# Patient Record
Sex: Male | Born: 1950
Health system: Southern US, Community
[De-identification: ages and names within clinical notes are randomized; demographics above are authoritative.]

## PROBLEM LIST (undated history)

## (undated) DIAGNOSIS — F329 Major depressive disorder, single episode, unspecified: Secondary | ICD-10-CM

## (undated) DIAGNOSIS — K219 Gastro-esophageal reflux disease without esophagitis: Secondary | ICD-10-CM

## (undated) DIAGNOSIS — C801 Malignant (primary) neoplasm, unspecified: Secondary | ICD-10-CM

## (undated) DIAGNOSIS — F32A Depression, unspecified: Secondary | ICD-10-CM

## (undated) DIAGNOSIS — J449 Chronic obstructive pulmonary disease, unspecified: Secondary | ICD-10-CM

## (undated) DIAGNOSIS — E119 Type 2 diabetes mellitus without complications: Secondary | ICD-10-CM

## (undated) DIAGNOSIS — E785 Hyperlipidemia, unspecified: Secondary | ICD-10-CM

## (undated) DIAGNOSIS — Z85528 Personal history of other malignant neoplasm of kidney: Secondary | ICD-10-CM

## (undated) DIAGNOSIS — N189 Chronic kidney disease, unspecified: Secondary | ICD-10-CM

## (undated) DIAGNOSIS — G459 Transient cerebral ischemic attack, unspecified: Secondary | ICD-10-CM

## (undated) DIAGNOSIS — M109 Gout, unspecified: Secondary | ICD-10-CM

## (undated) DIAGNOSIS — I10 Essential (primary) hypertension: Secondary | ICD-10-CM

## (undated) DIAGNOSIS — I2510 Atherosclerotic heart disease of native coronary artery without angina pectoris: Secondary | ICD-10-CM

## (undated) HISTORY — DX: Type 2 diabetes mellitus without complications: E11.9

## (undated) HISTORY — DX: Transient cerebral ischemic attack, unspecified: G45.9

## (undated) HISTORY — DX: Personal history of other malignant neoplasm of kidney: Z85.528

## (undated) HISTORY — DX: Gout, unspecified: M10.9

## (undated) HISTORY — DX: Gastro-esophageal reflux disease without esophagitis: K21.9

## (undated) HISTORY — PX: CHOLECYSTECTOMY: SHX55

## (undated) HISTORY — DX: Essential (primary) hypertension: I10

## (undated) HISTORY — DX: Depression, unspecified: F32.A

## (undated) HISTORY — PX: DIAGNOSTIC LAPAROSCOPY: SUR761

## (undated) HISTORY — PX: EYE SURGERY: SHX253

## (undated) HISTORY — PX: PROSTATECTOMY: SHX69

## (undated) HISTORY — DX: Major depressive disorder, single episode, unspecified: F32.9

## (undated) HISTORY — PX: CATARACT EXTRACTION: SUR2

---

## 1999-10-25 HISTORY — PX: KIDNEY SURGERY: SHX687

## 2005-05-22 ENCOUNTER — Emergency Department: Payer: Self-pay | Admitting: Emergency Medicine

## 2005-05-27 ENCOUNTER — Ambulatory Visit: Payer: Self-pay | Admitting: Internal Medicine

## 2005-05-31 ENCOUNTER — Ambulatory Visit: Payer: Self-pay | Admitting: Internal Medicine

## 2006-05-31 ENCOUNTER — Ambulatory Visit: Payer: Self-pay | Admitting: Internal Medicine

## 2006-09-21 ENCOUNTER — Ambulatory Visit: Payer: Self-pay | Admitting: Gastroenterology

## 2007-04-04 ENCOUNTER — Ambulatory Visit: Payer: Self-pay | Admitting: Internal Medicine

## 2007-06-11 ENCOUNTER — Ambulatory Visit: Payer: Self-pay | Admitting: Internal Medicine

## 2008-07-10 ENCOUNTER — Other Ambulatory Visit: Payer: Self-pay

## 2008-07-10 ENCOUNTER — Inpatient Hospital Stay: Payer: Self-pay | Admitting: Internal Medicine

## 2008-09-03 ENCOUNTER — Ambulatory Visit: Payer: Self-pay | Admitting: Internal Medicine

## 2012-12-06 ENCOUNTER — Observation Stay: Payer: Self-pay

## 2012-12-06 LAB — CK TOTAL AND CKMB (NOT AT ARMC): CK-MB: 1.6 ng/mL (ref 0.5–3.6)

## 2012-12-06 LAB — COMPREHENSIVE METABOLIC PANEL
Albumin: 3.9 g/dL (ref 3.4–5.0)
Anion Gap: 4 — ABNORMAL LOW (ref 7–16)
Bilirubin,Total: 0.8 mg/dL (ref 0.2–1.0)
Chloride: 106 mmol/L (ref 98–107)
Creatinine: 1.31 mg/dL — ABNORMAL HIGH (ref 0.60–1.30)
EGFR (African American): >60
EGFR (Non-African Amer.): 58 — ABNORMAL LOW
Osmolality: 278 (ref 275–301)
SGOT(AST): 36 U/L (ref 15–37)
Sodium: 138 mmol/L (ref 136–145)
Total Protein: 7.5 g/dL (ref 6.4–8.2)

## 2012-12-06 LAB — CBC
HCT: 47.4 % (ref 40.0–52.0)
MCHC: 33.4 g/dL (ref 32.0–36.0)
MCV: 87 fL (ref 80–100)
Platelet: 225 10*3/uL (ref 150–440)
RBC: 5.47 10*6/uL (ref 4.40–5.90)
RDW: 13.7 % (ref 11.5–14.5)
WBC: 11.6 10*3/uL — ABNORMAL HIGH (ref 3.8–10.6)

## 2012-12-06 LAB — URINALYSIS, COMPLETE
Blood: NEGATIVE
Ketone: NEGATIVE
Leukocyte Esterase: NEGATIVE
Ph: 6 (ref 4.5–8.0)
RBC,UR: <1 /HPF (ref 0–5)
Squamous Epithelial: NONE SEEN

## 2012-12-06 LAB — TROPONIN I: Troponin-I: <0.02 ng/mL

## 2012-12-07 LAB — CBC WITH DIFFERENTIAL/PLATELET
Basophil %: 0.9 %
Eosinophil #: 0.2 10*3/uL (ref 0.0–0.7)
HGB: 15.1 g/dL (ref 13.0–18.0)
Lymphocyte #: 2.2 10*3/uL (ref 1.0–3.6)
Lymphocyte %: 27.4 %
MCH: 29 pg (ref 26.0–34.0)
MCHC: 33.6 g/dL (ref 32.0–36.0)
Monocyte %: 11.8 %
Neutrophil #: 4.6 10*3/uL (ref 1.4–6.5)
Neutrophil %: 57 %
RBC: 5.2 10*6/uL (ref 4.40–5.90)
RDW: 13.3 % (ref 11.5–14.5)
WBC: 8.1 10*3/uL (ref 3.8–10.6)

## 2012-12-07 LAB — BASIC METABOLIC PANEL
Anion Gap: 6 — ABNORMAL LOW (ref 7–16)
Calcium, Total: 8.5 mg/dL (ref 8.5–10.1)
Co2: 28 mmol/L (ref 21–32)
EGFR (African American): >60
Glucose: 101 mg/dL — ABNORMAL HIGH (ref 65–99)
Osmolality: 282 (ref 275–301)
Sodium: 140 mmol/L (ref 136–145)

## 2012-12-07 LAB — MAGNESIUM: Magnesium: 2.1 mg/dL

## 2012-12-07 LAB — TROPONIN I: Troponin-I: <0.02 ng/mL

## 2012-12-07 LAB — LIPID PANEL: HDL Cholesterol: 31 mg/dL — ABNORMAL LOW (ref 40–60)

## 2013-05-27 ENCOUNTER — Ambulatory Visit: Payer: Self-pay | Admitting: General Practice

## 2013-08-05 ENCOUNTER — Encounter: Payer: Self-pay | Admitting: Specialist

## 2013-08-24 ENCOUNTER — Encounter: Payer: Self-pay | Admitting: Specialist

## 2013-09-23 ENCOUNTER — Encounter: Payer: Self-pay | Admitting: Specialist

## 2014-01-01 ENCOUNTER — Ambulatory Visit: Payer: Self-pay

## 2014-01-09 ENCOUNTER — Ambulatory Visit: Payer: Self-pay | Admitting: Urology

## 2014-02-25 ENCOUNTER — Ambulatory Visit: Payer: Self-pay | Admitting: Family Medicine

## 2014-02-25 LAB — DOT URINE DIP
Blood: NEGATIVE
GLUCOSE, UR: NEGATIVE mg/dL (ref 0–75)
PROTEIN: NEGATIVE
SPECIFIC GRAVITY: 1.015 (ref 1.003–1.030)

## 2014-02-28 DIAGNOSIS — Q619 Cystic kidney disease, unspecified: Secondary | ICD-10-CM | POA: Insufficient documentation

## 2014-02-28 DIAGNOSIS — Z8546 Personal history of malignant neoplasm of prostate: Secondary | ICD-10-CM | POA: Insufficient documentation

## 2014-02-28 DIAGNOSIS — Z85528 Personal history of other malignant neoplasm of kidney: Secondary | ICD-10-CM | POA: Insufficient documentation

## 2014-02-28 DIAGNOSIS — R109 Unspecified abdominal pain: Secondary | ICD-10-CM | POA: Insufficient documentation

## 2015-02-04 DIAGNOSIS — Q619 Cystic kidney disease, unspecified: Secondary | ICD-10-CM | POA: Diagnosis not present

## 2015-02-04 DIAGNOSIS — Z8546 Personal history of malignant neoplasm of prostate: Secondary | ICD-10-CM | POA: Diagnosis not present

## 2015-02-04 DIAGNOSIS — Z85528 Personal history of other malignant neoplasm of kidney: Secondary | ICD-10-CM | POA: Diagnosis not present

## 2015-02-13 NOTE — Discharge Summary (Signed)
PATIENT NAME:  Joel George, Joel George MR#:  [id number] DATE OF BIRTH:  06-06-1951  DATE OF ADMISSION:  12/06/2012 DATE OF DISCHARGE:  12/07/2012  PRIMARY CARE PHYSICIAN:  Dr. Adrian Prows.   DISCHARGE DIAGNOSES:  1.  Transient ischemic attack with facial numbness.  2.  Hypertension.   ADMISSION HISTORY AND PHYSICAL:  Please see admission note from February 13th.  Briefly, this is a 64 year old gentleman with known prostate and kidney disease who noted tingling on his left side of his face and drooling.  The symptoms lasted 15 to 20 minutes.  He came to the Emergency Room because he was concerned about a TIA and stroke.  Symptoms resolved.   HOSPITAL COURSE BY ISSUE:  1.  Facial numbness, likely TIA.  The patient was admitted.  He had serial troponins done which were negative.  Telemetry was normal.  The patient had    CANCELLED BY DICTATOR    ____________________________ Cheral Marker. Ola Spurr, MD dpf:ea D: 12/12/2012 22:05:16 ET T: 12/13/2012 06:01:45 ET JOB#: [id number]  cc: Cheral Marker. Ola Spurr, MD, <Dictator> Jimmy Ola Spurr MD ELECTRONICALLY SIGNED 12/26/2012 19:24

## 2015-02-13 NOTE — Discharge Summary (Signed)
PATIENT NAME:  Joel George, Joel George MR#:  [id number] DATE OF BIRTH:  1951/01/05  DATE OF ADMISSION:  12/06/2012 DATE OF DISCHARGE:  12/07/2012  PRIMARY CARE PHYSICIAN: Cheral Marker. Ola Spurr, MD   DISCHARGE DIAGNOSES:  1. Transient ischemic attack with left facial numbness.  2. Borderline hypertension.  3. Borderline renal insufficiency with solitary kidney.   HISTORY OF PRESENT ILLNESS: Please see admission history and physical for details. Briefly, this is a 64 year old relatively healthy gentleman, on no medications except for an aspirin a day, who presented with acute onset of left-sided facial numbness and face drawing down and drooling. This lasted 15 to 20 minutes. He was admitted for TIA workup.   HOSPITAL COURSE BY ISSUE:  1. TIA. The patient had resolution of symptoms prior to arrival. Telemetry was negative, and troponins were negative. Carotid Doppler showed no evidence of hemodynamically significant stenosis. Echocardiogram was normal. MRI of the brain showed no acute infarct. The patient will be discharged on aspirin 325 once a day as well as statin. His LDL was less than 100, but given the possible TIA, this will be followed closely.  2. Borderline hypertension. The patient has solitary kidney. Reconsider adding outpatient antihypertensives.  3. Kidney insufficiency. This has remained stable.   DISCHARGE MEDICATIONS:  1. Aspirin 325 once a day.  2. Lovastatin 20 mg once a day.   DISCHARGE FOLLOWUP: The patient will follow up with Dr. Ola Spurr within 2 weeks' time. He will call if worse or new symptoms occur.   DISCHARGE DIET: Low fat, low salt, regular consistency.   ACTIVITY: As tolerated.   This discharge took 35 minutes.    ____________________________ Cheral Marker. Ola Spurr, MD dpf:OSi D: 12/07/2012 14:47:30 ET T: 12/08/2012 09:17:20 ET JOB#: [id number]  cc: Cheral Marker. Ola Spurr, MD, <Dictator> Jermond Ola Spurr MD ELECTRONICALLY SIGNED 12/26/2012 19:23

## 2015-02-13 NOTE — H&P (Signed)
PATIENT NAME:  Joel George, Joel George MR#:  [id number] DATE OF BIRTH:  05/23/1951  DATE OF ADMISSION:  12/06/2012  PRIMARY CARE PHYSICIAN: Cheral Marker. Ola Spurr, MD   REFERRING PHYSICIAN: Conni Slipper, MD  CHIEF COMPLAINT: Left-sided facial numbness and tingling.   HISTORY OF PRESENT ILLNESS: The patient is a 64 year old male with a known history of prostate and kidney cancer who is being admitted for suspected TIA. The patient noticed tingling and numbness on his left side of his face, along with drooling from his right side of the corner of the mouth this afternoon. It lasted about 15 to 20 minutes. Symptoms went away after that. He was lying on his right side of the body and does not feel that had anything to do with it. He Googled it up on WebMD and found that this could be TIA. He took aspirin and was worried for TIA so came to the Emergency Department. While in the ED, his symptoms are gone. He is being admitted for further evaluation and management, as this was concerning for TIA. His CT head was negative.   PAST MEDICAL HISTORY: Kidney cancer and prostate cancer, status post left kidney removal.   ALLERGIES: No known drug allergies.   MEDICATIONS AT HOME: None.   SOCIAL HISTORY: Nonsmoking, occasional alcohol, no IV drugs of abuse. He is taking care of his parents.   FAMILY HISTORY: One brother had thyroid disease, otherwise unremarkable.   REVIEW OF SYSTEMS:  CONSTITUTIONAL: No fever, fatigue, weakness.  EYES: No blurred or double vision.  ENT: No tinnitus or ear pain.  RESPIRATORY: No cough, wheezing, hemoptysis.  CARDIOVASCULAR: No chest pain, orthopnea, edema.  GASTROINTESTINAL: No nausea, vomiting, diarrhea.  GENITOURINARY: No dysuria or hematuria.  ENDOCRINE: No polyuria or nocturia.  HEMATOLOGIC: No anemia or easy bruising.  SKIN: No rash or lesion.  MUSCULOSKELETAL: No arthritis or muscle cramps.  NEUROLOGIC: He had some tingling and numbness on his left side of face and drooling  from his right mouth corner which are gone.  PSYCHIATRIC: No history of anxiety or depression.   PHYSICAL EXAMINATION:  VITAL SIGNS: Temperature 98.5, heart rate 78 per minute, respirations 19 per minute, blood pressure 139/74 mmHg, saturating 96% on room air. GENERAL: The patient is a 64 year old male lying in the bed comfortably without any acute distress.  EYES: Pupils equal, round, reactive to light and accommodation. No scleral icterus. Extraocular muscles intact.  HENT: Head atraumatic, normocephalic. Oropharynx and nasopharynx clear.  NECK: Supple. No jugular venous distention. No thyroid enlargement or tenderness.  LUNGS: Clear to auscultation bilaterally. No wheezing, rales, rhonchi or crepitation.  CARDIOVASCULAR: S1, S2 normal. No murmurs, rubs or gallop.  ABDOMEN: Soft, nontender, nondistended. Bowel sounds present. No organomegaly or masses. He has left flank surgical scar from kidney removal.  NEUROLOGIC: Cranial nerves II through XII intact. Muscle strength 5/5 in all extremities. Sensation intact.  PSYCHIATRIC: The patient is oriented to time, place and person x 3.  SKIN: No obvious rash, lesion or ulcer, except surgical scar on the left side of his flank.  EXTREMITIES: No pedal edema, cyanosis or clubbing.   LABORATORY, RADIOLOGICAL AND DIAGNOSTIC DATA: Normal CBC except white count of 11.6. Normal first set of cardiac enzymes. Normal liver function tests. Normal BMP except creatinine of 1.31 and BUN of 20. UA was negative.   CT scan of the head while in the ED showed no acute abnormality.   EKG has not been done yet.   IMPRESSION AND PLAN:  1.  Suspected transient ischemic attack: Will do serial troponins, obtain 2-D echo, carotid Dopplers, and an MRI of the brain has been ordered. Will get physical and occupational therapy evaluation and management. Will start him on aspirin for his transient ischemic attack and statin. Will also check his fasting lipid profile.  2.  Acute  renal failure: Will hydrate her with IV fluids. Avoid any nephrotoxic medication. This is likely prerenal.   CODE STATUS: Full code.   TIME SPENT: Total time taking care of this patient: 45 minutes.   ____________________________ Lucina Mellow. Manuella Ghazi, MD vss:jm D: 12/06/2012 20:52:26 ET T: 12/06/2012 21:22:50 ET JOB#: [id number]  cc: Kyndall Chaplin S. Manuella Ghazi, MD, <Dictator> Cheral Marker. Ola Spurr, [place] MD ELECTRONICALLY SIGNED 12/08/2012 10:16

## 2015-05-13 DIAGNOSIS — H524 Presbyopia: Secondary | ICD-10-CM | POA: Diagnosis not present

## 2015-05-13 DIAGNOSIS — H2513 Age-related nuclear cataract, bilateral: Secondary | ICD-10-CM | POA: Diagnosis not present

## 2015-06-15 DIAGNOSIS — Z0000 Encounter for general adult medical examination without abnormal findings: Secondary | ICD-10-CM | POA: Diagnosis not present

## 2015-06-15 DIAGNOSIS — E785 Hyperlipidemia, unspecified: Secondary | ICD-10-CM | POA: Diagnosis not present

## 2015-06-15 DIAGNOSIS — E119 Type 2 diabetes mellitus without complications: Secondary | ICD-10-CM | POA: Diagnosis not present

## 2015-06-22 DIAGNOSIS — E119 Type 2 diabetes mellitus without complications: Secondary | ICD-10-CM | POA: Diagnosis not present

## 2015-06-22 DIAGNOSIS — E780 Pure hypercholesterolemia: Secondary | ICD-10-CM | POA: Diagnosis not present

## 2015-06-22 DIAGNOSIS — E6609 Other obesity due to excess calories: Secondary | ICD-10-CM | POA: Diagnosis not present

## 2015-06-22 DIAGNOSIS — N183 Chronic kidney disease, stage 3 (moderate): Secondary | ICD-10-CM | POA: Diagnosis not present

## 2015-06-23 DIAGNOSIS — Z23 Encounter for immunization: Secondary | ICD-10-CM | POA: Diagnosis not present

## 2015-07-02 ENCOUNTER — Other Ambulatory Visit: Payer: Self-pay | Admitting: Infectious Diseases

## 2015-07-02 DIAGNOSIS — Z136 Encounter for screening for cardiovascular disorders: Secondary | ICD-10-CM

## 2015-07-02 DIAGNOSIS — Z87891 Personal history of nicotine dependence: Secondary | ICD-10-CM

## 2015-07-13 ENCOUNTER — Ambulatory Visit: Admission: RE | Admit: 2015-07-13 | Payer: Self-pay | Source: Ambulatory Visit

## 2015-07-22 ENCOUNTER — Ambulatory Visit
Admission: RE | Admit: 2015-07-22 | Discharge: 2015-07-22 | Disposition: A | Discharge status: Home / Self Care | Payer: Medicare Other | Source: Ambulatory Visit | Attending: Infectious Diseases | Admitting: Infectious Diseases

## 2015-07-22 DIAGNOSIS — Z87891 Personal history of nicotine dependence: Secondary | ICD-10-CM | POA: Insufficient documentation

## 2015-07-22 DIAGNOSIS — Z136 Encounter for screening for cardiovascular disorders: Secondary | ICD-10-CM | POA: Diagnosis not present

## 2015-07-22 DIAGNOSIS — I714 Abdominal aortic aneurysm, without rupture: Secondary | ICD-10-CM | POA: Diagnosis not present

## 2015-08-22 DIAGNOSIS — Z23 Encounter for immunization: Secondary | ICD-10-CM | POA: Diagnosis not present

## 2016-03-18 DIAGNOSIS — Z905 Acquired absence of kidney: Secondary | ICD-10-CM | POA: Diagnosis not present

## 2016-03-18 DIAGNOSIS — Z8546 Personal history of malignant neoplasm of prostate: Secondary | ICD-10-CM | POA: Diagnosis not present

## 2016-03-18 DIAGNOSIS — Z85528 Personal history of other malignant neoplasm of kidney: Secondary | ICD-10-CM | POA: Diagnosis not present

## 2016-03-18 DIAGNOSIS — Q619 Cystic kidney disease, unspecified: Secondary | ICD-10-CM | POA: Diagnosis not present

## 2016-03-18 DIAGNOSIS — N281 Cyst of kidney, acquired: Secondary | ICD-10-CM | POA: Diagnosis not present

## 2016-04-20 DIAGNOSIS — H2513 Age-related nuclear cataract, bilateral: Secondary | ICD-10-CM | POA: Diagnosis not present

## 2016-04-20 DIAGNOSIS — H47032 Optic nerve hypoplasia, left eye: Secondary | ICD-10-CM | POA: Diagnosis not present

## 2016-04-20 DIAGNOSIS — H524 Presbyopia: Secondary | ICD-10-CM | POA: Diagnosis not present

## 2016-06-20 DIAGNOSIS — E119 Type 2 diabetes mellitus without complications: Secondary | ICD-10-CM | POA: Diagnosis not present

## 2016-06-20 DIAGNOSIS — Z114 Encounter for screening for human immunodeficiency virus [HIV]: Secondary | ICD-10-CM | POA: Diagnosis not present

## 2016-06-20 DIAGNOSIS — E7800 Pure hypercholesterolemia, unspecified: Secondary | ICD-10-CM | POA: Diagnosis not present

## 2016-06-20 DIAGNOSIS — N183 Chronic kidney disease, stage 3 (moderate): Secondary | ICD-10-CM | POA: Diagnosis not present

## 2016-06-20 DIAGNOSIS — E6609 Other obesity due to excess calories: Secondary | ICD-10-CM | POA: Diagnosis not present

## 2016-06-22 DIAGNOSIS — Z85528 Personal history of other malignant neoplasm of kidney: Secondary | ICD-10-CM | POA: Diagnosis not present

## 2016-06-22 DIAGNOSIS — Z8546 Personal history of malignant neoplasm of prostate: Secondary | ICD-10-CM | POA: Diagnosis not present

## 2016-06-22 DIAGNOSIS — N183 Chronic kidney disease, stage 3 (moderate): Secondary | ICD-10-CM | POA: Diagnosis not present

## 2016-06-22 DIAGNOSIS — E6609 Other obesity due to excess calories: Secondary | ICD-10-CM | POA: Diagnosis not present

## 2016-06-22 DIAGNOSIS — E119 Type 2 diabetes mellitus without complications: Secondary | ICD-10-CM | POA: Diagnosis not present

## 2016-06-22 DIAGNOSIS — E7800 Pure hypercholesterolemia, unspecified: Secondary | ICD-10-CM | POA: Diagnosis not present

## 2016-06-22 DIAGNOSIS — Z8739 Personal history of other diseases of the musculoskeletal system and connective tissue: Secondary | ICD-10-CM | POA: Diagnosis not present

## 2016-06-22 DIAGNOSIS — Z0000 Encounter for general adult medical examination without abnormal findings: Secondary | ICD-10-CM | POA: Diagnosis not present

## 2016-08-13 DIAGNOSIS — Z23 Encounter for immunization: Secondary | ICD-10-CM | POA: Diagnosis not present

## 2016-10-19 DIAGNOSIS — E119 Type 2 diabetes mellitus without complications: Secondary | ICD-10-CM | POA: Diagnosis not present

## 2016-10-25 DIAGNOSIS — N183 Chronic kidney disease, stage 3 (moderate): Secondary | ICD-10-CM | POA: Diagnosis not present

## 2016-10-25 DIAGNOSIS — E118 Type 2 diabetes mellitus with unspecified complications: Secondary | ICD-10-CM | POA: Insufficient documentation

## 2016-10-25 DIAGNOSIS — E7800 Pure hypercholesterolemia, unspecified: Secondary | ICD-10-CM | POA: Diagnosis not present

## 2016-10-25 DIAGNOSIS — R809 Proteinuria, unspecified: Secondary | ICD-10-CM | POA: Insufficient documentation

## 2016-11-21 DIAGNOSIS — E1122 Type 2 diabetes mellitus with diabetic chronic kidney disease: Secondary | ICD-10-CM | POA: Diagnosis not present

## 2016-11-21 DIAGNOSIS — N183 Chronic kidney disease, stage 3 (moderate): Secondary | ICD-10-CM | POA: Diagnosis not present

## 2016-11-21 DIAGNOSIS — R809 Proteinuria, unspecified: Secondary | ICD-10-CM | POA: Diagnosis not present

## 2016-11-21 DIAGNOSIS — I129 Hypertensive chronic kidney disease with stage 1 through stage 4 chronic kidney disease, or unspecified chronic kidney disease: Secondary | ICD-10-CM | POA: Diagnosis not present

## 2016-12-27 DIAGNOSIS — H25013 Cortical age-related cataract, bilateral: Secondary | ICD-10-CM | POA: Diagnosis not present

## 2016-12-27 DIAGNOSIS — H18413 Arcus senilis, bilateral: Secondary | ICD-10-CM | POA: Diagnosis not present

## 2016-12-27 DIAGNOSIS — H2512 Age-related nuclear cataract, left eye: Secondary | ICD-10-CM | POA: Diagnosis not present

## 2016-12-27 DIAGNOSIS — H25043 Posterior subcapsular polar age-related cataract, bilateral: Secondary | ICD-10-CM | POA: Diagnosis not present

## 2016-12-27 DIAGNOSIS — H2513 Age-related nuclear cataract, bilateral: Secondary | ICD-10-CM | POA: Diagnosis not present

## 2017-01-04 DIAGNOSIS — I129 Hypertensive chronic kidney disease with stage 1 through stage 4 chronic kidney disease, or unspecified chronic kidney disease: Secondary | ICD-10-CM | POA: Diagnosis not present

## 2017-01-04 DIAGNOSIS — N183 Chronic kidney disease, stage 3 (moderate): Secondary | ICD-10-CM | POA: Diagnosis not present

## 2017-01-04 DIAGNOSIS — R809 Proteinuria, unspecified: Secondary | ICD-10-CM | POA: Diagnosis not present

## 2017-01-04 DIAGNOSIS — E1122 Type 2 diabetes mellitus with diabetic chronic kidney disease: Secondary | ICD-10-CM | POA: Diagnosis not present

## 2017-01-04 DIAGNOSIS — N2581 Secondary hyperparathyroidism of renal origin: Secondary | ICD-10-CM | POA: Diagnosis not present

## 2017-01-18 DIAGNOSIS — N183 Chronic kidney disease, stage 3 (moderate): Secondary | ICD-10-CM | POA: Diagnosis not present

## 2017-01-18 DIAGNOSIS — E1122 Type 2 diabetes mellitus with diabetic chronic kidney disease: Secondary | ICD-10-CM | POA: Diagnosis not present

## 2017-01-18 DIAGNOSIS — E785 Hyperlipidemia, unspecified: Secondary | ICD-10-CM | POA: Diagnosis not present

## 2017-01-18 DIAGNOSIS — I10 Essential (primary) hypertension: Secondary | ICD-10-CM | POA: Diagnosis not present

## 2017-02-13 DIAGNOSIS — Z961 Presence of intraocular lens: Secondary | ICD-10-CM | POA: Diagnosis not present

## 2017-02-13 DIAGNOSIS — H2512 Age-related nuclear cataract, left eye: Secondary | ICD-10-CM | POA: Diagnosis not present

## 2017-02-14 DIAGNOSIS — H2511 Age-related nuclear cataract, right eye: Secondary | ICD-10-CM | POA: Diagnosis not present

## 2017-02-27 DIAGNOSIS — H2511 Age-related nuclear cataract, right eye: Secondary | ICD-10-CM | POA: Diagnosis not present

## 2017-02-27 DIAGNOSIS — Z961 Presence of intraocular lens: Secondary | ICD-10-CM | POA: Diagnosis not present

## 2017-03-28 DIAGNOSIS — E785 Hyperlipidemia, unspecified: Secondary | ICD-10-CM | POA: Diagnosis not present

## 2017-03-28 DIAGNOSIS — E1122 Type 2 diabetes mellitus with diabetic chronic kidney disease: Secondary | ICD-10-CM | POA: Diagnosis not present

## 2017-03-28 DIAGNOSIS — I129 Hypertensive chronic kidney disease with stage 1 through stage 4 chronic kidney disease, or unspecified chronic kidney disease: Secondary | ICD-10-CM | POA: Diagnosis not present

## 2017-03-28 DIAGNOSIS — N183 Chronic kidney disease, stage 3 (moderate): Secondary | ICD-10-CM | POA: Diagnosis not present

## 2017-04-11 DIAGNOSIS — Z8546 Personal history of malignant neoplasm of prostate: Secondary | ICD-10-CM | POA: Diagnosis not present

## 2017-04-11 DIAGNOSIS — Q619 Cystic kidney disease, unspecified: Secondary | ICD-10-CM | POA: Diagnosis not present

## 2017-04-11 DIAGNOSIS — Z85528 Personal history of other malignant neoplasm of kidney: Secondary | ICD-10-CM | POA: Diagnosis not present

## 2017-04-18 DIAGNOSIS — E784 Other hyperlipidemia: Secondary | ICD-10-CM | POA: Diagnosis not present

## 2017-04-18 DIAGNOSIS — N183 Chronic kidney disease, stage 3 (moderate): Secondary | ICD-10-CM | POA: Diagnosis not present

## 2017-04-18 DIAGNOSIS — E119 Type 2 diabetes mellitus without complications: Secondary | ICD-10-CM | POA: Diagnosis not present

## 2017-04-25 DIAGNOSIS — E118 Type 2 diabetes mellitus with unspecified complications: Secondary | ICD-10-CM | POA: Diagnosis not present

## 2017-04-25 DIAGNOSIS — Z122 Encounter for screening for malignant neoplasm of respiratory organs: Secondary | ICD-10-CM | POA: Diagnosis not present

## 2017-04-25 DIAGNOSIS — Z1159 Encounter for screening for other viral diseases: Secondary | ICD-10-CM | POA: Diagnosis not present

## 2017-04-25 DIAGNOSIS — N183 Chronic kidney disease, stage 3 (moderate): Secondary | ICD-10-CM | POA: Diagnosis not present

## 2017-04-25 DIAGNOSIS — E7800 Pure hypercholesterolemia, unspecified: Secondary | ICD-10-CM | POA: Diagnosis not present

## 2017-04-25 DIAGNOSIS — Z6839 Body mass index (BMI) 39.0-39.9, adult: Secondary | ICD-10-CM | POA: Diagnosis not present

## 2017-04-28 ENCOUNTER — Telehealth: Payer: Self-pay | Admitting: *Deleted

## 2017-04-28 NOTE — Telephone Encounter (Signed)
Received referral for low dose lung cancer screening CT scan. Message left at phone number listed in EMR for patient to call me back to facilitate scheduling scan.  

## 2017-05-03 ENCOUNTER — Telehealth: Payer: Self-pay | Admitting: *Deleted

## 2017-05-03 DIAGNOSIS — Z87891 Personal history of nicotine dependence: Secondary | ICD-10-CM

## 2017-05-03 NOTE — Telephone Encounter (Signed)
Received referral for initial lung cancer screening scan. Contacted patient and obtained smoking history,(former, quit 10/24/2006, 32 pack year history) as well as answering questions related to screening process. Patient denies signs of lung cancer such as weight loss or hemoptysis. Patient denies comorbidity that would prevent curative treatment if lung cancer were found. Patient is scheduled for shared decision making visit and CT scan on 05/16/17.

## 2017-05-12 ENCOUNTER — Encounter: Payer: Self-pay | Admitting: Oncology

## 2017-05-16 ENCOUNTER — Inpatient Hospital Stay: Payer: Medicare Other | Attending: Oncology | Admitting: Oncology

## 2017-05-16 ENCOUNTER — Ambulatory Visit
Admission: RE | Admit: 2017-05-16 | Discharge: 2017-05-16 | Disposition: A | Discharge status: Home / Self Care | Payer: Medicare Other | Source: Ambulatory Visit | Attending: Oncology | Admitting: Oncology

## 2017-05-16 DIAGNOSIS — Z122 Encounter for screening for malignant neoplasm of respiratory organs: Secondary | ICD-10-CM

## 2017-05-16 DIAGNOSIS — Z87891 Personal history of nicotine dependence: Secondary | ICD-10-CM | POA: Diagnosis not present

## 2017-05-16 DIAGNOSIS — I700 Atherosclerosis of aorta: Secondary | ICD-10-CM | POA: Insufficient documentation

## 2017-05-16 NOTE — Progress Notes (Signed)
In accordance with CMS guidelines, patient has met eligibility criteria including age, absence of signs or symptoms of lung cancer.  Social History  Substance Use Topics  . Smoking status: Former Smoker    Packs/day: 1.00    Years: 32.00    Quit date: 10/24/2006  . Smokeless tobacco: Not on file  . Alcohol use Not on file     A shared decision-making session was conducted prior to the performance of CT scan. This includes one or more decision aids, includes benefits and harms of screening, follow-up diagnostic testing, over-diagnosis, false positive rate, and total radiation exposure.  Counseling on the importance of adherence to annual lung cancer LDCT screening, impact of co-morbidities, and ability or willingness to undergo diagnosis and treatment is imperative for compliance of the program.  Counseling on the importance of continued smoking cessation for former smokers; the importance of smoking cessation for current smokers, and information about tobacco cessation interventions have been given to patient including [place] and 1800 quit [place] programs.  Written order for lung cancer screening with LDCT has been given to the patient and any and all questions have been answered to the best of my abilities.   Yearly follow up will be coordinated by Burgess Estelle, Thoracic Navigator.  Faythe Casa, NP 05/16/2017 2:25 PM

## 2017-05-23 ENCOUNTER — Encounter: Payer: Self-pay | Admitting: *Deleted

## 2017-08-22 DIAGNOSIS — E7800 Pure hypercholesterolemia, unspecified: Secondary | ICD-10-CM | POA: Diagnosis not present

## 2017-08-22 DIAGNOSIS — Z1159 Encounter for screening for other viral diseases: Secondary | ICD-10-CM | POA: Diagnosis not present

## 2017-08-22 DIAGNOSIS — E118 Type 2 diabetes mellitus with unspecified complications: Secondary | ICD-10-CM | POA: Diagnosis not present

## 2017-08-26 DIAGNOSIS — Z23 Encounter for immunization: Secondary | ICD-10-CM | POA: Diagnosis not present

## 2017-08-29 DIAGNOSIS — E7800 Pure hypercholesterolemia, unspecified: Secondary | ICD-10-CM | POA: Diagnosis not present

## 2017-08-29 DIAGNOSIS — N183 Chronic kidney disease, stage 3 (moderate): Secondary | ICD-10-CM | POA: Diagnosis not present

## 2017-08-29 DIAGNOSIS — E118 Type 2 diabetes mellitus with unspecified complications: Secondary | ICD-10-CM | POA: Diagnosis not present

## 2017-08-29 DIAGNOSIS — Z6839 Body mass index (BMI) 39.0-39.9, adult: Secondary | ICD-10-CM | POA: Diagnosis not present

## 2017-10-09 ENCOUNTER — Other Ambulatory Visit: Payer: Self-pay | Admitting: General Surgery

## 2017-11-16 DIAGNOSIS — N183 Chronic kidney disease, stage 3 (moderate): Secondary | ICD-10-CM | POA: Diagnosis not present

## 2017-11-16 DIAGNOSIS — I129 Hypertensive chronic kidney disease with stage 1 through stage 4 chronic kidney disease, or unspecified chronic kidney disease: Secondary | ICD-10-CM | POA: Diagnosis not present

## 2017-11-16 DIAGNOSIS — R809 Proteinuria, unspecified: Secondary | ICD-10-CM | POA: Diagnosis not present

## 2017-11-16 DIAGNOSIS — E1122 Type 2 diabetes mellitus with diabetic chronic kidney disease: Secondary | ICD-10-CM | POA: Diagnosis not present

## 2017-11-28 DIAGNOSIS — E118 Type 2 diabetes mellitus with unspecified complications: Secondary | ICD-10-CM | POA: Diagnosis not present

## 2017-12-05 DIAGNOSIS — E118 Type 2 diabetes mellitus with unspecified complications: Secondary | ICD-10-CM | POA: Diagnosis not present

## 2017-12-05 DIAGNOSIS — Z6839 Body mass index (BMI) 39.0-39.9, adult: Secondary | ICD-10-CM | POA: Diagnosis not present

## 2017-12-05 DIAGNOSIS — E7800 Pure hypercholesterolemia, unspecified: Secondary | ICD-10-CM | POA: Diagnosis not present

## 2017-12-05 DIAGNOSIS — N183 Chronic kidney disease, stage 3 (moderate): Secondary | ICD-10-CM | POA: Diagnosis not present

## 2018-03-28 DIAGNOSIS — E7800 Pure hypercholesterolemia, unspecified: Secondary | ICD-10-CM | POA: Diagnosis not present

## 2018-03-28 DIAGNOSIS — N183 Chronic kidney disease, stage 3 (moderate): Secondary | ICD-10-CM | POA: Diagnosis not present

## 2018-03-28 DIAGNOSIS — E118 Type 2 diabetes mellitus with unspecified complications: Secondary | ICD-10-CM | POA: Diagnosis not present

## 2018-04-03 DIAGNOSIS — E7800 Pure hypercholesterolemia, unspecified: Secondary | ICD-10-CM | POA: Diagnosis not present

## 2018-04-03 DIAGNOSIS — E119 Type 2 diabetes mellitus without complications: Secondary | ICD-10-CM | POA: Diagnosis not present

## 2018-04-03 DIAGNOSIS — N183 Chronic kidney disease, stage 3 (moderate): Secondary | ICD-10-CM | POA: Diagnosis not present

## 2018-04-05 DIAGNOSIS — E119 Type 2 diabetes mellitus without complications: Secondary | ICD-10-CM | POA: Diagnosis not present

## 2018-04-05 DIAGNOSIS — Z9841 Cataract extraction status, right eye: Secondary | ICD-10-CM | POA: Diagnosis not present

## 2018-04-05 DIAGNOSIS — H52222 Regular astigmatism, left eye: Secondary | ICD-10-CM | POA: Diagnosis not present

## 2018-04-05 DIAGNOSIS — Z9842 Cataract extraction status, left eye: Secondary | ICD-10-CM | POA: Diagnosis not present

## 2018-04-10 ENCOUNTER — Encounter: Payer: Self-pay | Admitting: Urology

## 2018-04-10 ENCOUNTER — Ambulatory Visit (INDEPENDENT_AMBULATORY_CARE_PROVIDER_SITE_OTHER): Payer: Medicare Other | Admitting: Urology

## 2018-04-10 VITALS — BP 129/78 | HR 85 | Ht 68.0 in | Wt 265.4 lb

## 2018-04-10 DIAGNOSIS — Z85528 Personal history of other malignant neoplasm of kidney: Secondary | ICD-10-CM | POA: Diagnosis not present

## 2018-04-10 DIAGNOSIS — Z8546 Personal history of malignant neoplasm of prostate: Secondary | ICD-10-CM

## 2018-04-10 LAB — URINALYSIS, COMPLETE
BILIRUBIN UA: NEGATIVE
GLUCOSE, UA: NEGATIVE
KETONES UA: NEGATIVE
Leukocytes, UA: NEGATIVE
Nitrite, UA: NEGATIVE
PROTEIN UA: NEGATIVE
RBC, UA: NEGATIVE
SPEC GRAV UA: 1.015 (ref 1.005–1.030)
Urobilinogen, Ur: 0.2 mg/dL (ref 0.2–1.0)
pH, UA: 5.5 (ref 5.0–7.5)

## 2018-04-10 NOTE — Progress Notes (Signed)
04/10/2018 1:49 PM   Joel George August 06, 1951 [id number]  Referring provider: Leonel Ramsay, MD [place] [place], [place] [address]  Chief Complaint  Patient presents with  . Kidney Cancer    HPI: Joel George is a 67 year old male previously followed by Dr. Jacqlyn Larsen for renal cell carcinoma and adenocarcinoma the prostate.  He was diagnosed with both prostate cancer and renal cell carcinoma in 2001 and underwent radical retropubic prostatectomy and left nephrectomy 6 months apart.  His PSA has remained undetectable since his surgery.  He has right renal cysts and his last renal ultrasound in May 2017 showed no suspicious lesions.  He has no voiding complaints.  Denies dysuria or gross hematuria.  Denies flank, abdominal, pelvic or scrotal pain.   PMH: Past Medical History:  Diagnosis Date  . Depression   . Diabetes mellitus without complication ([place])   . GERD (gastroesophageal reflux disease)   . Gout   . History of kidney cancer   . Hypertension   . TIA (transient ischemic attack)     Surgical History: Past Surgical History:  Procedure Laterality Date  . CATARACT EXTRACTION    . CHOLECYSTECTOMY    . KIDNEY SURGERY  2001  . PROSTATECTOMY      Home Medications:  Allergies as of 04/10/2018   No Known Allergies     Medication List        Accurate as of 04/10/18  1:49 PM. Always use your most recent med list.          atorvastatin 20 MG tablet Commonly known as:  LIPITOR   calcium-vitamin D 500-200 MG-UNIT Tabs tablet Commonly known as:  OSCAL WITH D Take by mouth.   famotidine 20 MG tablet Commonly known as:  PEPCID Take by mouth.   glimepiride 2 MG tablet Commonly known as:  AMARYL   lisinopril 5 MG tablet Commonly known as:  PRINIVIL,ZESTRIL   PRECISION QID TEST test strip Generic drug:  glucose blood       Allergies: No Known Allergies  Family History: Family History  Problem Relation Age of Onset  . Parkinson's disease Father    . Kidney failure Mother     Social History:  reports that he quit smoking about 11 years ago. He has a 32.00 pack-year smoking history. He has never used smokeless tobacco. He reports that he drinks alcohol. He reports that he does not use drugs.  ROS: UROLOGY Frequent Urination?: Yes Hard to postpone urination?: No Burning/pain with urination?: No Get up at night to urinate?: Yes Leakage of urine?: Yes Urine stream starts and stops?: No Trouble starting stream?: No Do you have to strain to urinate?: No Blood in urine?: No Urinary tract infection?: No Sexually transmitted disease?: No Injury to kidneys or bladder?: No Painful intercourse?: No Weak stream?: No Erection problems?: No Penile pain?: No  Gastrointestinal Nausea?: No Vomiting?: No Indigestion/heartburn?: Yes Diarrhea?: No Constipation?: No  Constitutional Fever: No Night sweats?: No Weight loss?: No Fatigue?: No  Skin Skin rash/lesions?: No Itching?: No  Eyes Blurred vision?: No Double vision?: No  Ears/Nose/Throat Sore throat?: No Sinus problems?: No  Hematologic/Lymphatic Swollen glands?: No Easy bruising?: No  Cardiovascular Leg swelling?: No Chest pain?: No  Respiratory Cough?: No Shortness of breath?: No  Endocrine Excessive thirst?: No  Musculoskeletal Back pain?: No Joint pain?: No  Neurological Headaches?: No Dizziness?: No  Psychologic Depression?: No Anxiety?: No  Physical Exam: BP 129/78 (BP Location: Left Arm, Patient Position: Sitting, Cuff  Size: Large)   Pulse 85   Ht 5\' 8"  (1.727 m)   Wt 265 lb 6.4 oz (120.4 kg)   SpO2 99%   BMI 40.35 kg/m   Constitutional:  Alert and oriented, No acute distress. HEENT: [place] AT, moist mucus membranes.  Trachea midline, no masses. Cardiovascular: No clubbing, cyanosis, or edema. Respiratory: Normal respiratory effort, no increased work of breathing. GI: Abdomen is soft, nontender, nondistended, no abdominal masses GU: No  CVA tenderness Lymph: No cervical or inguinal lymphadenopathy. Skin: No rashes, bruises or suspicious lesions. Neurologic: Grossly intact, no focal deficits, moving all 4 extremities. Psychiatric: Normal mood and affect.  Laboratory Data:  Urinalysis Dipstick/microscopy negative     Assessment & Plan:   67 year old male with a history of prostate cancer and renal cell carcinoma.  PSA was drawn today and he will be notified with results.  A renal ultrasound will also be scheduled.  Continue annual follow-up.   Return in about 1 year (around 04/11/2019) for Recheck, PSA.  Abbie Sons, [place] [address], [place], [place] [address] [phone]

## 2018-04-11 ENCOUNTER — Telehealth: Payer: Self-pay

## 2018-04-11 LAB — PSA: Prostate Specific Ag, Serum: <0.1 ng/mL (ref 0.0–4.0)

## 2018-04-11 NOTE — Telephone Encounter (Signed)
Pt wife informed

## 2018-04-11 NOTE — Telephone Encounter (Signed)
-----   Message from Abbie Sons, MD sent at 04/11/2018  7:35 AM EDT ----- PSA remains undetectable at <0.1

## 2018-04-19 ENCOUNTER — Ambulatory Visit: Payer: Medicare Other

## 2018-04-24 ENCOUNTER — Ambulatory Visit
Admission: RE | Admit: 2018-04-24 | Discharge: 2018-04-24 | Disposition: A | Discharge status: Home / Self Care | Payer: Medicare Other | Source: Ambulatory Visit | Attending: Urology | Admitting: Urology

## 2018-04-24 DIAGNOSIS — Z905 Acquired absence of kidney: Secondary | ICD-10-CM | POA: Diagnosis not present

## 2018-04-24 DIAGNOSIS — Z85528 Personal history of other malignant neoplasm of kidney: Secondary | ICD-10-CM | POA: Diagnosis not present

## 2018-04-24 DIAGNOSIS — N281 Cyst of kidney, acquired: Secondary | ICD-10-CM | POA: Diagnosis not present

## 2018-04-25 ENCOUNTER — Telehealth: Payer: Self-pay

## 2018-04-25 NOTE — Telephone Encounter (Signed)
-----   Message from Abbie Sons, MD sent at 04/25/2018  9:42 AM EDT ----- Renal ultrasound showed stable renal cysts and no solid masses.

## 2018-04-25 NOTE — Telephone Encounter (Signed)
Pt informed

## 2018-05-07 ENCOUNTER — Telehealth: Payer: Self-pay | Admitting: Nurse Practitioner

## 2018-05-08 ENCOUNTER — Telehealth: Payer: Self-pay | Admitting: *Deleted

## 2018-05-08 DIAGNOSIS — Z122 Encounter for screening for malignant neoplasm of respiratory organs: Secondary | ICD-10-CM

## 2018-05-08 DIAGNOSIS — Z87891 Personal history of nicotine dependence: Secondary | ICD-10-CM

## 2018-05-08 NOTE — Telephone Encounter (Signed)
Notified patient that annual lung cancer screening low dose CT scan is due currently or will be in near future. Confirmed that patient is within the age range of 55-77, and asymptomatic, (no signs or symptoms of lung cancer). Patient denies illness that would prevent curative treatment for lung cancer if found. Verified smoking history, (former, quit 2008, 32 pack year). The shared decision making visit was done 05/16/17. Patient is agreeable for CT scan being scheduled.

## 2018-05-15 DIAGNOSIS — E1122 Type 2 diabetes mellitus with diabetic chronic kidney disease: Secondary | ICD-10-CM | POA: Diagnosis not present

## 2018-05-15 DIAGNOSIS — Z79899 Other long term (current) drug therapy: Secondary | ICD-10-CM | POA: Diagnosis not present

## 2018-05-15 DIAGNOSIS — N183 Chronic kidney disease, stage 3 (moderate): Secondary | ICD-10-CM | POA: Diagnosis not present

## 2018-05-15 DIAGNOSIS — Z0000 Encounter for general adult medical examination without abnormal findings: Secondary | ICD-10-CM | POA: Diagnosis not present

## 2018-05-15 DIAGNOSIS — Z1211 Encounter for screening for malignant neoplasm of colon: Secondary | ICD-10-CM | POA: Diagnosis not present

## 2018-05-17 ENCOUNTER — Ambulatory Visit
Admission: RE | Admit: 2018-05-17 | Discharge: 2018-05-17 | Disposition: A | Discharge status: Home / Self Care | Payer: Medicare Other | Source: Ambulatory Visit | Attending: Nurse Practitioner | Admitting: Nurse Practitioner

## 2018-05-17 DIAGNOSIS — Z122 Encounter for screening for malignant neoplasm of respiratory organs: Secondary | ICD-10-CM

## 2018-05-17 DIAGNOSIS — I700 Atherosclerosis of aorta: Secondary | ICD-10-CM | POA: Insufficient documentation

## 2018-05-17 DIAGNOSIS — J439 Emphysema, unspecified: Secondary | ICD-10-CM | POA: Diagnosis not present

## 2018-05-17 DIAGNOSIS — Z87891 Personal history of nicotine dependence: Secondary | ICD-10-CM | POA: Diagnosis not present

## 2018-05-17 DIAGNOSIS — I2510 Atherosclerotic heart disease of native coronary artery without angina pectoris: Secondary | ICD-10-CM | POA: Diagnosis not present

## 2018-05-21 ENCOUNTER — Encounter: Payer: Self-pay | Admitting: *Deleted

## 2018-06-05 DIAGNOSIS — I10 Essential (primary) hypertension: Secondary | ICD-10-CM | POA: Diagnosis not present

## 2018-06-05 DIAGNOSIS — E785 Hyperlipidemia, unspecified: Secondary | ICD-10-CM | POA: Diagnosis not present

## 2018-06-05 DIAGNOSIS — E1122 Type 2 diabetes mellitus with diabetic chronic kidney disease: Secondary | ICD-10-CM | POA: Diagnosis not present

## 2018-06-05 DIAGNOSIS — N183 Chronic kidney disease, stage 3 (moderate): Secondary | ICD-10-CM | POA: Diagnosis not present

## 2018-06-11 DIAGNOSIS — E1122 Type 2 diabetes mellitus with diabetic chronic kidney disease: Secondary | ICD-10-CM | POA: Diagnosis not present

## 2018-06-11 DIAGNOSIS — N183 Chronic kidney disease, stage 3 (moderate): Secondary | ICD-10-CM | POA: Diagnosis not present

## 2018-06-11 DIAGNOSIS — R809 Proteinuria, unspecified: Secondary | ICD-10-CM | POA: Diagnosis not present

## 2018-06-11 DIAGNOSIS — M109 Gout, unspecified: Secondary | ICD-10-CM | POA: Diagnosis not present

## 2018-06-11 DIAGNOSIS — I129 Hypertensive chronic kidney disease with stage 1 through stage 4 chronic kidney disease, or unspecified chronic kidney disease: Secondary | ICD-10-CM | POA: Diagnosis not present

## 2018-07-05 DIAGNOSIS — Z86010 Personal history of colonic polyps: Secondary | ICD-10-CM | POA: Diagnosis not present

## 2018-08-11 DIAGNOSIS — Z23 Encounter for immunization: Secondary | ICD-10-CM | POA: Diagnosis not present

## 2018-08-29 DIAGNOSIS — E785 Hyperlipidemia, unspecified: Secondary | ICD-10-CM | POA: Diagnosis not present

## 2018-08-29 DIAGNOSIS — E1122 Type 2 diabetes mellitus with diabetic chronic kidney disease: Secondary | ICD-10-CM | POA: Diagnosis not present

## 2018-08-29 DIAGNOSIS — I10 Essential (primary) hypertension: Secondary | ICD-10-CM | POA: Diagnosis not present

## 2018-08-29 DIAGNOSIS — N183 Chronic kidney disease, stage 3 (moderate): Secondary | ICD-10-CM | POA: Diagnosis not present

## 2018-09-06 DIAGNOSIS — R809 Proteinuria, unspecified: Secondary | ICD-10-CM | POA: Diagnosis not present

## 2018-09-06 DIAGNOSIS — I129 Hypertensive chronic kidney disease with stage 1 through stage 4 chronic kidney disease, or unspecified chronic kidney disease: Secondary | ICD-10-CM | POA: Diagnosis not present

## 2018-09-06 DIAGNOSIS — N183 Chronic kidney disease, stage 3 (moderate): Secondary | ICD-10-CM | POA: Diagnosis not present

## 2018-09-06 DIAGNOSIS — E1122 Type 2 diabetes mellitus with diabetic chronic kidney disease: Secondary | ICD-10-CM | POA: Diagnosis not present

## 2018-09-07 ENCOUNTER — Ambulatory Visit
Admission: RE | Admit: 2018-09-07 | Discharge: 2018-09-07 | Disposition: A | Discharge status: Home / Self Care | Payer: Medicare Other | Source: Ambulatory Visit | Attending: Gastroenterology | Admitting: Gastroenterology

## 2018-09-07 ENCOUNTER — Ambulatory Visit: Payer: Medicare Other | Admitting: Certified Registered Nurse Anesthetist

## 2018-09-07 ENCOUNTER — Encounter
Admission: RE | Disposition: A | Discharge status: Home / Self Care | Payer: Self-pay | Source: Ambulatory Visit | Attending: Gastroenterology

## 2018-09-07 DIAGNOSIS — K6389 Other specified diseases of intestine: Secondary | ICD-10-CM | POA: Diagnosis not present

## 2018-09-07 DIAGNOSIS — K621 Rectal polyp: Secondary | ICD-10-CM | POA: Insufficient documentation

## 2018-09-07 DIAGNOSIS — K5730 Diverticulosis of large intestine without perforation or abscess without bleeding: Secondary | ICD-10-CM | POA: Diagnosis not present

## 2018-09-07 DIAGNOSIS — Z8546 Personal history of malignant neoplasm of prostate: Secondary | ICD-10-CM | POA: Insufficient documentation

## 2018-09-07 DIAGNOSIS — D126 Benign neoplasm of colon, unspecified: Secondary | ICD-10-CM | POA: Diagnosis not present

## 2018-09-07 DIAGNOSIS — F329 Major depressive disorder, single episode, unspecified: Secondary | ICD-10-CM | POA: Diagnosis not present

## 2018-09-07 DIAGNOSIS — Z87891 Personal history of nicotine dependence: Secondary | ICD-10-CM | POA: Diagnosis not present

## 2018-09-07 DIAGNOSIS — D122 Benign neoplasm of ascending colon: Secondary | ICD-10-CM | POA: Insufficient documentation

## 2018-09-07 DIAGNOSIS — K219 Gastro-esophageal reflux disease without esophagitis: Secondary | ICD-10-CM | POA: Diagnosis not present

## 2018-09-07 DIAGNOSIS — Z1211 Encounter for screening for malignant neoplasm of colon: Secondary | ICD-10-CM | POA: Diagnosis not present

## 2018-09-07 DIAGNOSIS — Z86010 Personal history of colonic polyps: Secondary | ICD-10-CM | POA: Diagnosis not present

## 2018-09-07 DIAGNOSIS — Z85528 Personal history of other malignant neoplasm of kidney: Secondary | ICD-10-CM | POA: Diagnosis not present

## 2018-09-07 DIAGNOSIS — E785 Hyperlipidemia, unspecified: Secondary | ICD-10-CM | POA: Diagnosis not present

## 2018-09-07 DIAGNOSIS — Z8673 Personal history of transient ischemic attack (TIA), and cerebral infarction without residual deficits: Secondary | ICD-10-CM | POA: Diagnosis not present

## 2018-09-07 DIAGNOSIS — M109 Gout, unspecified: Secondary | ICD-10-CM | POA: Insufficient documentation

## 2018-09-07 DIAGNOSIS — I129 Hypertensive chronic kidney disease with stage 1 through stage 4 chronic kidney disease, or unspecified chronic kidney disease: Secondary | ICD-10-CM | POA: Diagnosis not present

## 2018-09-07 DIAGNOSIS — D123 Benign neoplasm of transverse colon: Secondary | ICD-10-CM | POA: Diagnosis not present

## 2018-09-07 DIAGNOSIS — D120 Benign neoplasm of cecum: Secondary | ICD-10-CM | POA: Diagnosis not present

## 2018-09-07 DIAGNOSIS — E1122 Type 2 diabetes mellitus with diabetic chronic kidney disease: Secondary | ICD-10-CM | POA: Insufficient documentation

## 2018-09-07 DIAGNOSIS — D124 Benign neoplasm of descending colon: Secondary | ICD-10-CM | POA: Diagnosis not present

## 2018-09-07 DIAGNOSIS — Z48815 Encounter for surgical aftercare following surgery on the digestive system: Secondary | ICD-10-CM | POA: Diagnosis not present

## 2018-09-07 DIAGNOSIS — N183 Chronic kidney disease, stage 3 (moderate): Secondary | ICD-10-CM | POA: Insufficient documentation

## 2018-09-07 DIAGNOSIS — K635 Polyp of colon: Secondary | ICD-10-CM | POA: Diagnosis not present

## 2018-09-07 DIAGNOSIS — K5790 Diverticulosis of intestine, part unspecified, without perforation or abscess without bleeding: Secondary | ICD-10-CM | POA: Diagnosis not present

## 2018-09-07 HISTORY — DX: Malignant (primary) neoplasm, unspecified: C80.1

## 2018-09-07 HISTORY — PX: COLONOSCOPY WITH PROPOFOL: SHX5780

## 2018-09-07 HISTORY — DX: Chronic kidney disease, unspecified: N18.9

## 2018-09-07 HISTORY — DX: Hyperlipidemia, unspecified: E78.5

## 2018-09-07 LAB — GLUCOSE, CAPILLARY: GLUCOSE-CAPILLARY: 105 mg/dL — AB (ref 70–99)

## 2018-09-07 SURGERY — COLONOSCOPY WITH PROPOFOL
Anesthesia: General

## 2018-09-07 MED ORDER — LIDOCAINE HCL (CARDIAC) PF 100 MG/5ML IV SOSY
PREFILLED_SYRINGE | INTRAVENOUS | Status: DC | PRN
  Administered 2018-09-07: 25 mg via INTRATRACHEAL

## 2018-09-07 MED ORDER — EPHEDRINE SULFATE 50 MG/ML IJ SOLN
INTRAMUSCULAR | Status: AC
  Filled 2018-09-07: qty 1

## 2018-09-07 MED ORDER — EPHEDRINE SULFATE 50 MG/ML IJ SOLN
INTRAMUSCULAR | Status: DC | PRN
  Administered 2018-09-07 (×2): 5 mg via INTRAVENOUS

## 2018-09-07 MED ORDER — PROPOFOL 500 MG/50ML IV EMUL
INTRAVENOUS | Status: AC
  Filled 2018-09-07: qty 50

## 2018-09-07 MED ORDER — PROPOFOL 500 MG/50ML IV EMUL
INTRAVENOUS | Status: DC | PRN
  Administered 2018-09-07: 80 ug/kg/min via INTRAVENOUS

## 2018-09-07 MED ORDER — SODIUM CHLORIDE 0.9 % IV SOLN
INTRAVENOUS | Status: DC
  Administered 2018-09-07: 1000 mL via INTRAVENOUS

## 2018-09-07 NOTE — Transfer of Care (Signed)
Immediate Anesthesia Transfer of Care Note  Patient: Joel George  Procedure(s) Performed: COLONOSCOPY WITH PROPOFOL (N/A )  Patient Location: PACU and Endoscopy Unit  Anesthesia Type:General  Level of Consciousness: awake, alert  and oriented  Airway & Oxygen Therapy: Patient Spontanous Breathing  Post-op Assessment: Report given to RN  Post vital signs: Reviewed and stable  Last Vitals:  Vitals Value Taken Time  BP 87/54 09/07/2018  8:30 AM  Temp 36.1 C 09/07/2018  8:20 AM  Pulse 91 09/07/2018  8:33 AM  Resp 21 09/07/2018  8:33 AM  SpO2 97 % 09/07/2018  8:33 AM  Vitals shown include unvalidated device data.  Last Pain:  Vitals:   09/07/18 0820  TempSrc: Tympanic  PainSc:          Complications: No apparent anesthesia complications

## 2018-09-07 NOTE — Anesthesia Post-op Follow-up Note (Signed)
Anesthesia QCDR form completed.        

## 2018-09-07 NOTE — Anesthesia Postprocedure Evaluation (Signed)
Anesthesia Post Note  Patient: Joel George  Procedure(s) Performed: COLONOSCOPY WITH PROPOFOL (N/A )  Patient location during evaluation: PACU Anesthesia Type: General Level of consciousness: awake and alert Pain management: pain level controlled Vital Signs Assessment: post-procedure vital signs reviewed and stable Respiratory status: spontaneous breathing, nonlabored ventilation and respiratory function stable Cardiovascular status: blood pressure returned to baseline and stable Postop Assessment: no apparent nausea or vomiting Anesthetic complications: no     Last Vitals:  Vitals:   09/07/18 0850 09/07/18 0900  BP: 101/67 115/67  Pulse: 78 76  Resp: 19 17  Temp:    SpO2: 99% 99%    Last Pain:  Vitals:   09/07/18 0820  TempSrc: Tympanic  PainSc:                  Durenda Hurt

## 2018-09-07 NOTE — Op Note (Signed)
[facility] Patient Name: Joel George Procedure Date: 09/07/2018 7:04 AM MRN: [id number] Account #: [id number] Date of Birth: November 23, 1950 Admit Type: Outpatient Age: 67 Room: [facility] ENDO ROOM 3 Gender: Male Note Status: Finalized Procedure:            Colonoscopy Indications:          Personal history of colonic polyps Providers:            Lollie Sails, MD Referring MD:         Caprice Renshaw MD (Referring MD) Medicines:            Monitored Anesthesia Care Complications:        No immediate complications. Procedure:            Pre-Anesthesia Assessment:                       - ASA Grade Assessment: II - A patient with mild                        systemic disease.                       After obtaining informed consent, the colonoscope was                        passed under direct vision. Throughout the procedure,                        the patient's blood pressure, pulse, and oxygen                        saturations were monitored continuously. The was                        introduced through the anus and advanced to the the                        cecum, identified by appendiceal orifice and ileocecal                        valve. The colonoscopy was performed with moderate                        difficulty. The patient tolerated the procedure well.                        The quality of the bowel preparation was fair. Findings:      Multiple medium-mouthed diverticula were found in the sigmoid colon,       descending colon, transverse colon, ascending colon and cecum.      Four sessile polyps were found in the rectum. The polyps were 1 to 4 mm       in size. These polyps were removed with a cold biopsy forceps, one with       a cold forcep. Resection and retrieval were complete.      A 4 mm polyp was found in the distal descending colon. The polyp was       sessile. The polyp was removed with a cold snare. Resection and       retrieval  were complete.      A  localized area of granular mucosa was found in the proximal descending       colon. This was biopsied/removed with a cold snare for histology.      A 5 mm polyp was found in the splenic flexure. The polyp was sessile.       The polyp was removed with a cold snare. Resection and retrieval were       complete.      Two sessile polyps were found in the proximal ascending colon. The       polyps were 3 to 5 mm in size. These polyps were removed with a cold       snare. Resection and retrieval were complete.      A 3 mm polyp was found in the cecum. The polyp was sessile. The polyp       was removed with a piecemeal technique using a cold biopsy forceps.       Resection and retrieval were complete.      Two sessile polyps were found in the ascending colon. The polyps were 2       to 3 mm in size. These polyps were removed with a cold biopsy forceps.       Resection and retrieval were complete.      Three sessile polyps were found in the distal transverse colon. The       polyps were 2 to 3 mm in size. These polyps were removed with a cold       snare. Resection and retrieval were complete.      The retroflexed view of the distal rectum and anal verge was normal and       showed no anal or rectal abnormalities.      The digital rectal exam was normal. Impression:           - Preparation of the colon was fair.                       - Diverticulosis in the sigmoid colon, in the                        descending colon, in the transverse colon, in the                        ascending colon and in the cecum.                       - Four 1 to 4 mm polyps in the rectum, removed with a                        cold biopsy forceps. Resected and retrieved.                       - One 4 mm polyp in the distal descending colon,                        removed with a cold snare. Resected and retrieved.                       - Granularity in the proximal descending colon.                         Biopsied.                       -  One 5 mm polyp at the splenic flexure, removed with a                        cold snare. Resected and retrieved.                       - Two 3 to 5 mm polyps in the proximal ascending colon,                        removed with a cold snare. Resected and retrieved.                       - One 3 mm polyp in the cecum, removed piecemeal using                        a cold biopsy forceps. Resected and retrieved.                       - Two 2 to 3 mm polyps in the ascending colon, removed                        with a cold biopsy forceps. Resected and retrieved.                       - Three 2 to 3 mm polyps in the distal transverse                        colon, removed with a cold snare. Resected and                        retrieved.                       - The distal rectum and anal verge are normal on                        retroflexion view. Recommendation:       - Discharge patient to home.                       - Telephone GI clinic for pathology results in 1 week.                       - May need to have genetic study for polyposis syndrome                        if mor than 10 adenomas,. Procedure Code(s):    --- Professional ---                       [phone], Colonoscopy, flexible; with removal of tumor(s),                        polyp(s), or other lesion(s) by snare technique                       16010, 59, Colonoscopy, flexible; with biopsy, single  or multiple Diagnosis Code(s):    --- Professional ---                       K62.1, Rectal polyp                       D12.2, Benign neoplasm of ascending colon                       D12.3, Benign neoplasm of transverse colon (hepatic                        flexure or splenic flexure)                       D12.4, Benign neoplasm of descending colon                       D12.0, Benign neoplasm of cecum                       K63.89, Other specified diseases of intestine                        Z86.010, Personal history of colonic polyps                       K57.30, Diverticulosis of large intestine without                        perforation or abscess without bleeding CPT copyright 2018 American Medical Association. All rights reserved. The codes documented in this report are preliminary and upon coder review may  be revised to meet current compliance requirements. Lollie Sails, MD 09/07/2018 8:35:12 AM This report has been signed electronically. Number of Addenda: 0 Note Initiated On: 09/07/2018 7:04 AM Scope Withdrawal Time: 0 hours 22 minutes 2 seconds  Total Procedure Duration: 0 hours 47 minutes 15 seconds       [facility]

## 2018-09-07 NOTE — Anesthesia Preprocedure Evaluation (Signed)
Anesthesia Evaluation  Patient identified by MRN, date of birth, ID band Patient awake    Reviewed: Allergy & Precautions, H&P , NPO status , Patient's Chart, lab work & pertinent test results  Airway Mallampati: III  TM Distance: >3 FB Neck ROM: full   Comment: beard Dental  (+) Teeth Intact   Pulmonary neg pulmonary ROS, former smoker,    breath sounds clear to auscultation       Cardiovascular hypertension, negative cardio ROS   Rhythm:regular Rate:Normal     Neuro/Psych PSYCHIATRIC DISORDERS Depression TIAnegative neurological ROS  negative psych ROS   GI/Hepatic negative GI ROS, Neg liver ROS, GERD  ,  Endo/Other  negative endocrine ROSdiabetes  Renal/GU CRFRenal diseasenegative Renal ROS  negative genitourinary   Musculoskeletal   Abdominal   Peds  Hematology negative hematology ROS (+)   Anesthesia Other Findings Past Medical History: No date: Cancer ([place])     Comment:  renal cancer,prostate cancer No date: Chronic kidney disease     Comment:  stage 3 No date: Depression No date: Diabetes mellitus without complication (HCC) No date: GERD (gastroesophageal reflux disease) No date: Gout No date: History of kidney cancer No date: Hyperlipidemia No date: Hypertension No date: TIA (transient ischemic attack)  Past Surgical History: No date: CATARACT EXTRACTION No date: CHOLECYSTECTOMY 2001: KIDNEY SURGERY No date: PROSTATECTOMY  BMI    Body Mass Index:  39.53 kg/m      Reproductive/Obstetrics negative OB ROS                             Anesthesia Physical Anesthesia Plan  ASA: II  Anesthesia Plan: General   Post-op Pain Management:    Induction:   PONV Risk Score and Plan: Propofol infusion and TIVA  Airway Management Planned:   Additional Equipment:   Intra-op Plan:   Post-operative Plan:   Informed Consent: I have reviewed the patients History and  Physical, chart, labs and discussed the procedure including the risks, benefits and alternatives for the proposed anesthesia with the patient or authorized representative who has indicated his/her understanding and acceptance.   Dental Advisory Given  Plan Discussed with: Anesthesiologist  Anesthesia Plan Comments:         Anesthesia Quick Evaluation

## 2018-09-07 NOTE — H&P (Signed)
Outpatient short stay form Pre-procedure 09/07/2018 7:25 AM Joel Sails MD  Primary Physician: Dr Derinda Late  Reason for visit: Colonoscopy  History of present illness: Patient is a 67 year old male presenting today as above.  He has personal history of adenomatous colon polyps with his last procedure being 10/28/2013.  Tolerated his prep well.  He takes no aspirin or blood thinning agent with the exception of a 81 mg aspirin that has been held.  He has no abdominal pain rectal bleeding or diarrhea.  Current Facility-Administered Medications:  .  0.9 %  sodium chloride infusion, , Intravenous, Continuous, Joel Sails, MD, Last Rate: 20 mL/hr at 09/07/18 0700, 1,000 mL at 09/07/18 0700  Medications Prior to Admission  Medication Sig Dispense Refill Last Dose  . aspirin EC 81 MG tablet Take 81 mg by mouth daily.   09/06/2018 at Unknown time  . atorvastatin (LIPITOR) 20 MG tablet    09/06/2018 at Unknown time  . calcium-vitamin D (OSCAL WITH D) 500-200 MG-UNIT TABS tablet Take by mouth.   09/06/2018 at Unknown time  . CHOLECALCIFEROL PO Take 1,000 Units by mouth daily.   09/06/2018 at Unknown time  . famotidine (PEPCID) 20 MG tablet Take by mouth.   09/06/2018 at Unknown time  . glimepiride (AMARYL) 2 MG tablet    09/06/2018 at Unknown time  . glucose blood (PRECISION QID TEST) test strip    09/06/2018 at Unknown time  . lisinopril (PRINIVIL,ZESTRIL) 5 MG tablet    09/07/2018 at 0545  . ranitidine (ZANTAC) 150 MG tablet Take 150 mg by mouth 2 (two) times daily.   09/06/2018 at Unknown time     No Known Allergies   Past Medical History:  Diagnosis Date  . Cancer ([place])    renal cancer,prostate cancer  . Chronic kidney disease    stage 3  . Depression   . Diabetes mellitus without complication ([place])   . GERD (gastroesophageal reflux disease)   . Gout   . History of kidney cancer   . Hyperlipidemia   . Hypertension   . TIA (transient ischemic attack)     Review  of systems:      Physical Exam    Heart and lungs: Regular rate and rhythm without rub or gallop, lungs are bilaterally clear.    HEENT: Normocephalic atraumatic eyes are anicteric    Other:    Pertinant exam for procedure: Soft nontender nondistended bowel sounds positive normoactive, protuberant    Planned proceedures: Colonoscopy and indicated procedures. I have discussed the risks benefits and complications of procedures to include not limited to bleeding, infection, perforation and the risk of sedation and the patient wishes to proceed.    Joel Sails, MD Gastroenterology 09/07/2018  7:25 AM

## 2018-09-10 DIAGNOSIS — Z79899 Other long term (current) drug therapy: Secondary | ICD-10-CM | POA: Diagnosis not present

## 2018-09-10 DIAGNOSIS — E1122 Type 2 diabetes mellitus with diabetic chronic kidney disease: Secondary | ICD-10-CM | POA: Diagnosis not present

## 2018-09-10 DIAGNOSIS — N183 Chronic kidney disease, stage 3 (moderate): Secondary | ICD-10-CM | POA: Diagnosis not present

## 2018-09-10 LAB — SURGICAL PATHOLOGY

## 2018-09-11 ENCOUNTER — Encounter: Payer: Self-pay | Admitting: Gastroenterology

## 2018-09-13 DIAGNOSIS — E7800 Pure hypercholesterolemia, unspecified: Secondary | ICD-10-CM | POA: Diagnosis not present

## 2018-09-13 DIAGNOSIS — Z79899 Other long term (current) drug therapy: Secondary | ICD-10-CM | POA: Diagnosis not present

## 2018-09-13 DIAGNOSIS — D649 Anemia, unspecified: Secondary | ICD-10-CM | POA: Diagnosis not present

## 2018-09-13 DIAGNOSIS — Z125 Encounter for screening for malignant neoplasm of prostate: Secondary | ICD-10-CM | POA: Diagnosis not present

## 2018-09-13 DIAGNOSIS — E1122 Type 2 diabetes mellitus with diabetic chronic kidney disease: Secondary | ICD-10-CM | POA: Diagnosis not present

## 2018-09-13 DIAGNOSIS — N183 Chronic kidney disease, stage 3 (moderate): Secondary | ICD-10-CM | POA: Diagnosis not present

## 2018-11-24 IMAGING — US US RENAL
1 series · 14 of 25 positions shown · non-contrast
Comparison: 07/22/2015 ultrasound

CLINICAL DATA: 67-year-old male with history of LEFT renal cell
carcinoma and LEFT nephrectomy. Follow-up RIGHT kidney.

EXAM:
RENAL / URINARY TRACT ULTRASOUND COMPLETE

[Series 1: us renal · 33 acquisitions, 14 frames shown]
[im 1/33]
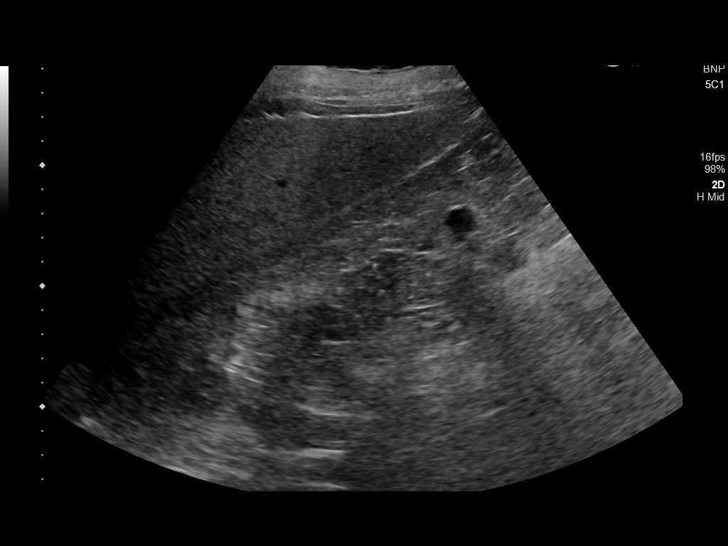
[im 3/33]
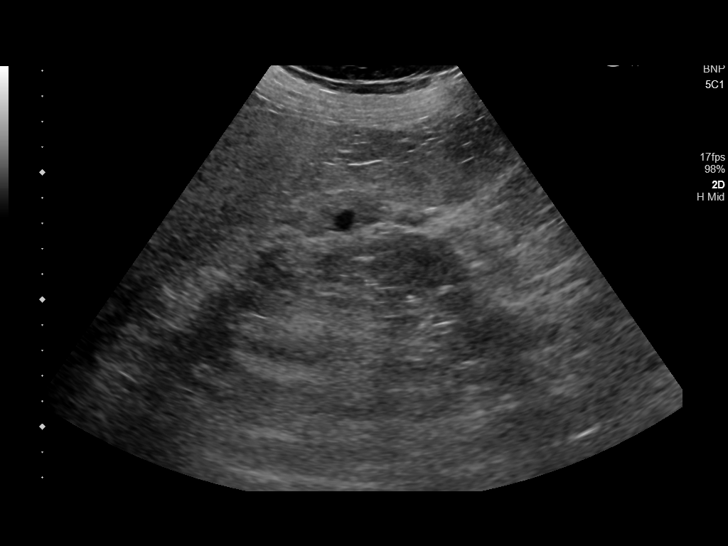
[im 6/33]
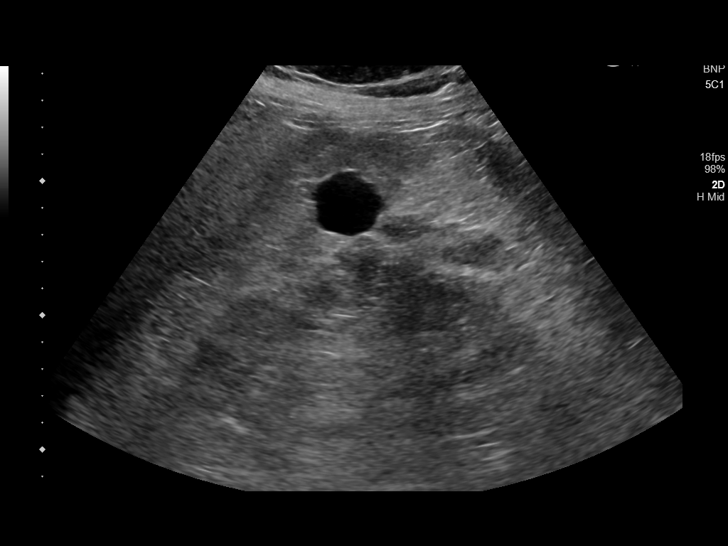
[im 9/33]
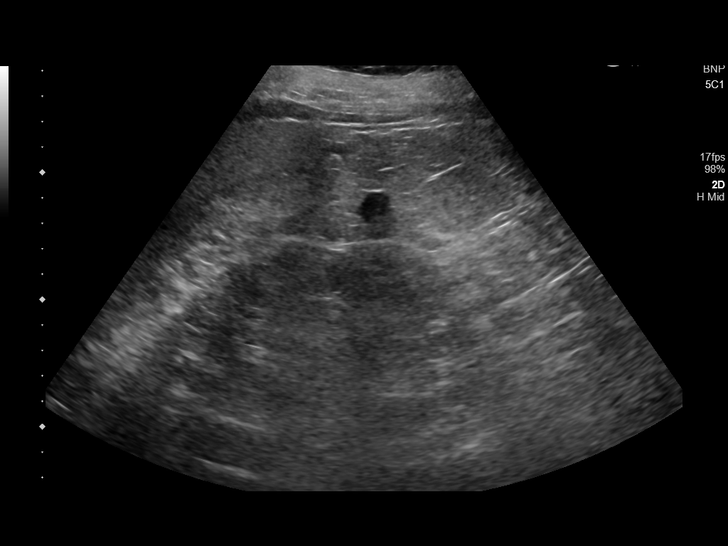
[im 11/33]
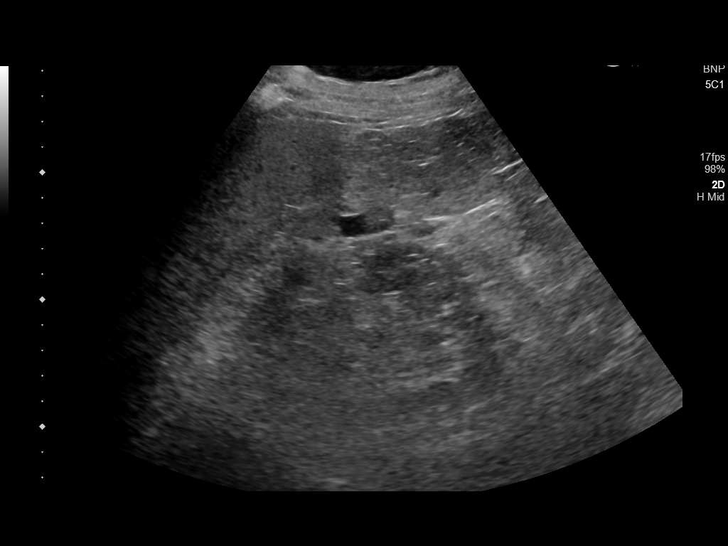
[im 13/33]
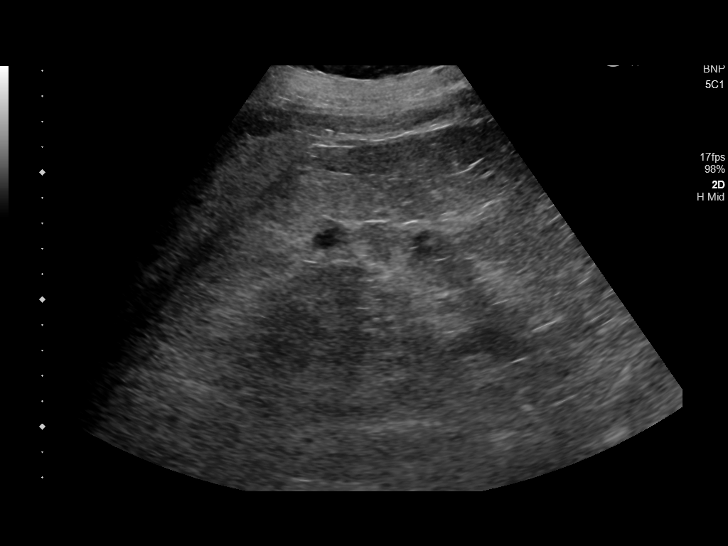
[im 15/33]
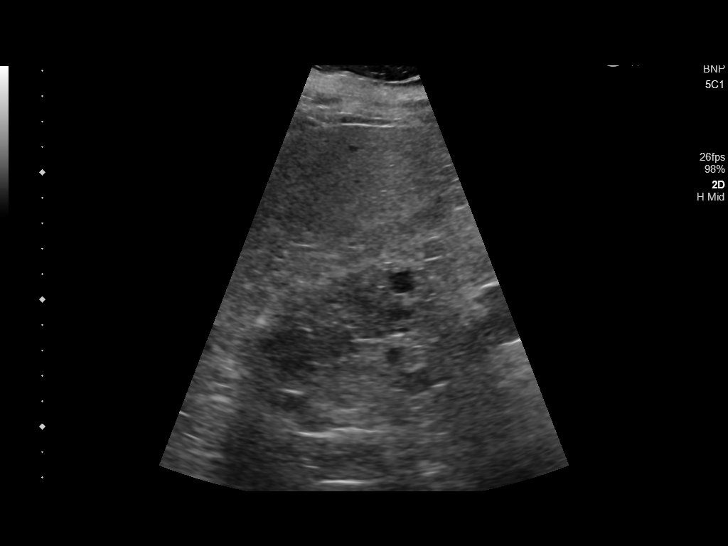
[im 18/33]
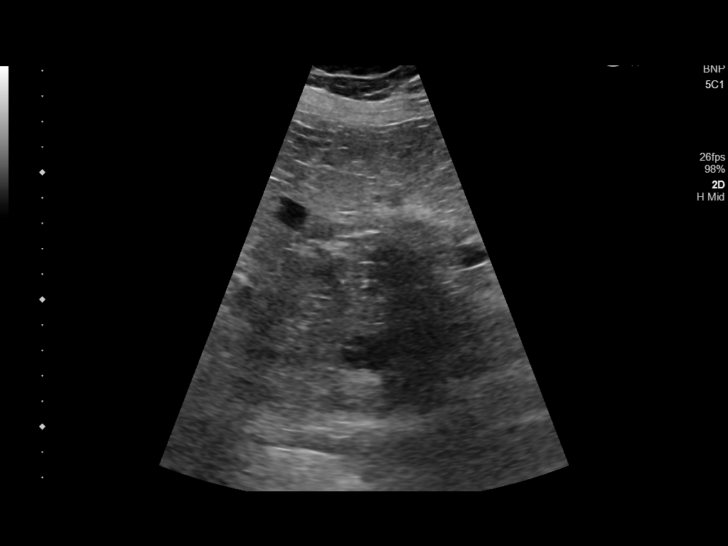
[im 21/33]
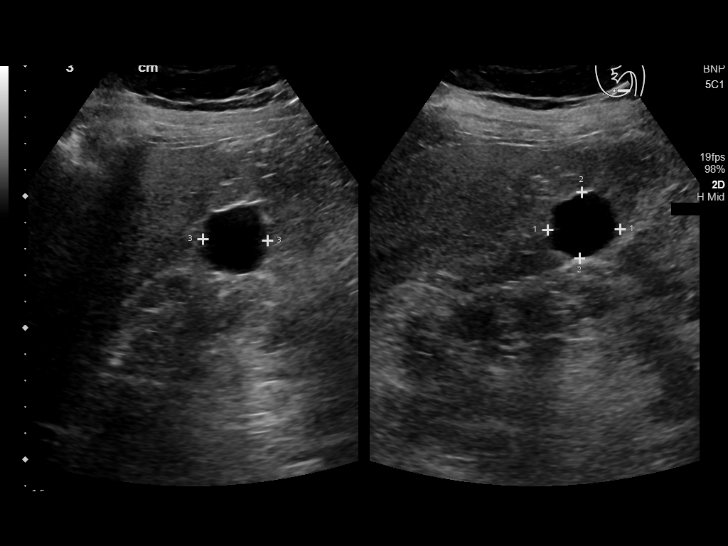
[im 22/33]
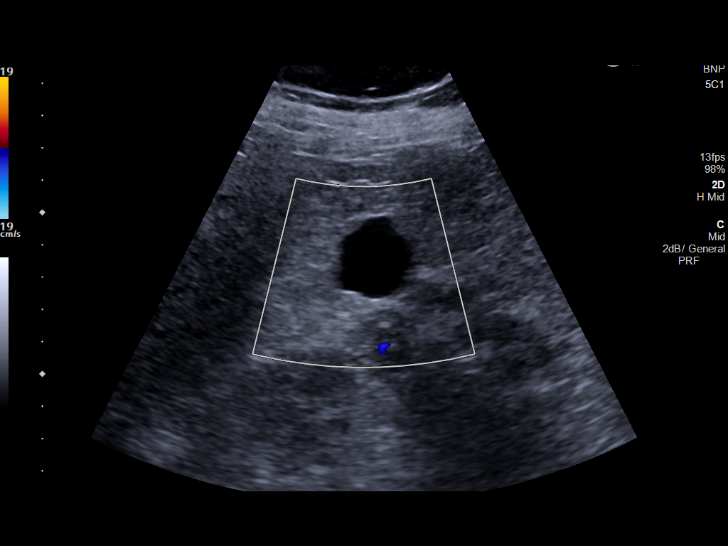
[im 25/33]
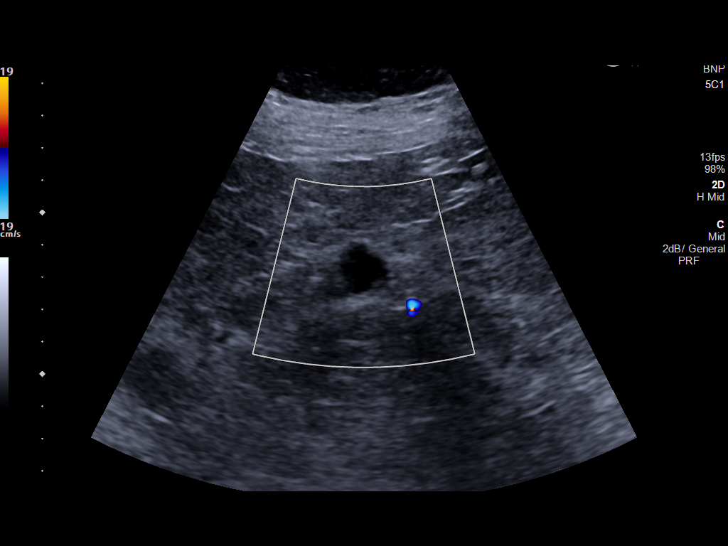
[im 27/33]
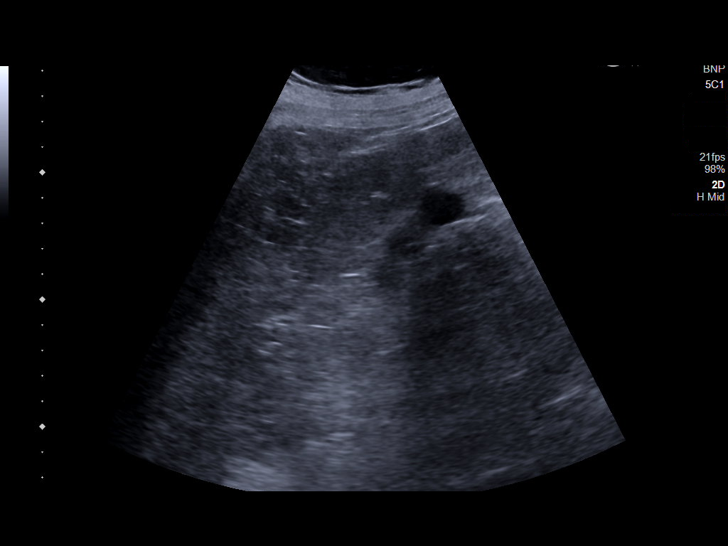
[im 30/33]
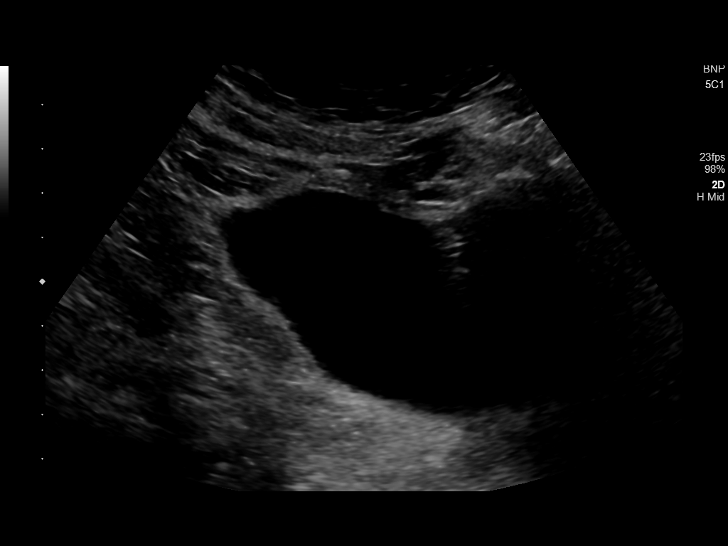
[im 33/33]
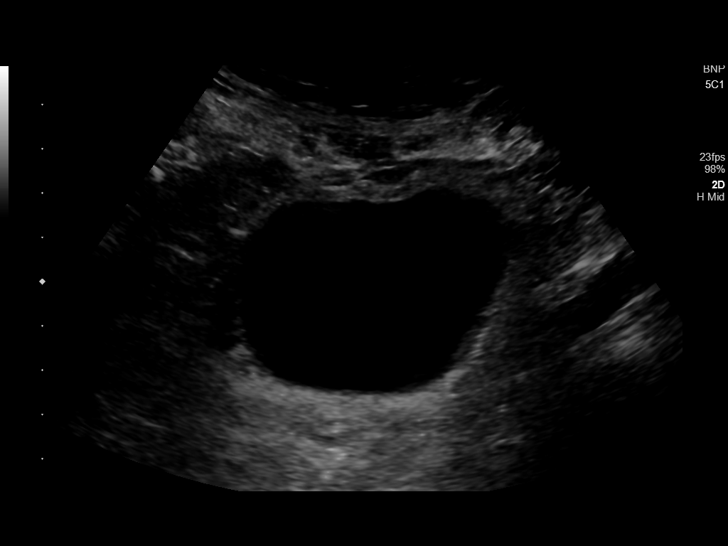

[14 of 25 positions shown; findings below may reference images not displayed]

FINDINGS: Right Kidney:

Length: 12.7 cm.. Increased renal echogenicity noted with cortical
thinning. Scattered benign cysts are present, the largest measuring
2.8 cm extending off of the mid RIGHT kidney. There is no evidence
of solid mass or hydronephrosis.

Left Kidney:

Not visualized compatible with nephrectomy

Bladder:

Appears normal for degree of bladder distention.
IMPRESSION: 1. No evidence of suspicious RIGHT renal mass or hydronephrosis.
Benign RIGHT renal cysts again noted.
2. Increased RIGHT renal echogenicity with cortical thinning -
question chronic medical renal disease.
3. Status post LEFT nephrectomy.

## 2018-12-17 IMAGING — CT CT CHEST LUNG CANCER SCREENING LOW DOSE W/O CM
2 of 5 series · 15 of 40 positions shown, 18 images · non-contrast
Comparison: 05/16/2017 screening chest CT.

CLINICAL DATA: 67-year-old asymptomatic male former smoker with 32
pack-year smoking history, quit smoking 11 years prior.

EXAM:
CT CHEST WITHOUT CONTRAST LOW-DOSE FOR LUNG CANCER SCREENING
TECHNIQUE: Multidetector CT imaging of the chest was performed following the
standard protocol without IV contrast.

[Series 3: lung · axial · 0.73mm/px · z∈[-1261,-976]mm · 12 of 317 slices shown, 15 images (1 of 2)]
[im 16/317  mediastinal]
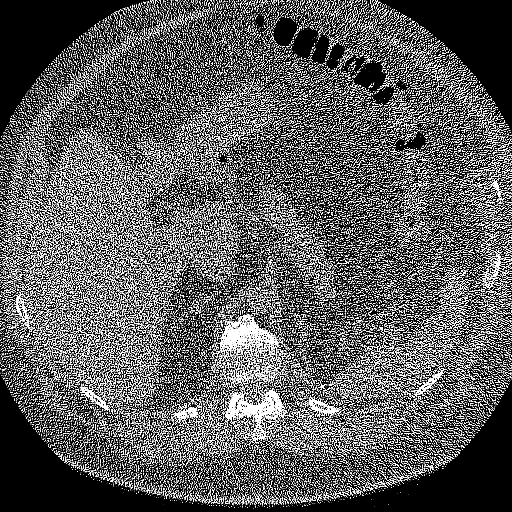
[im 16/317  lung]
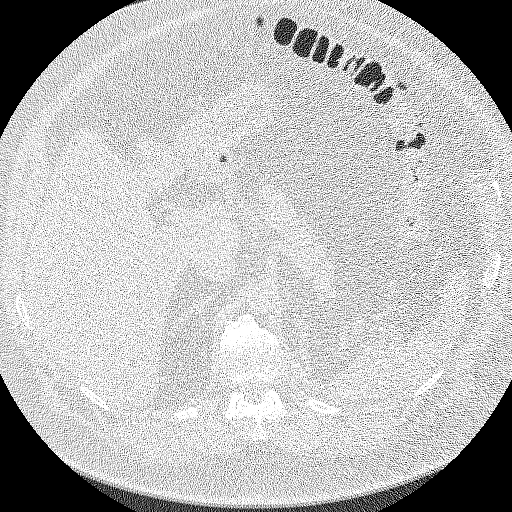
[im 48/317  lung]
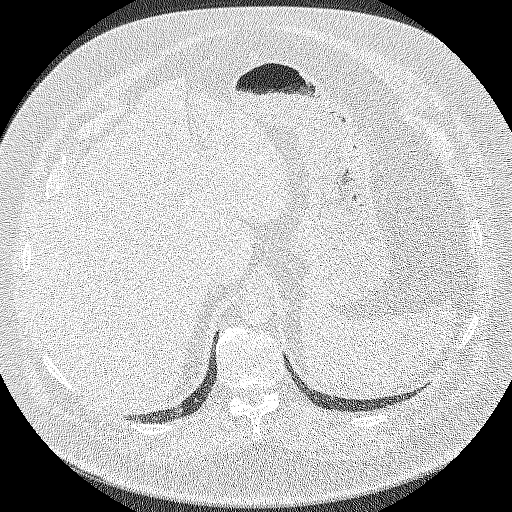
[im 64/317  lung]
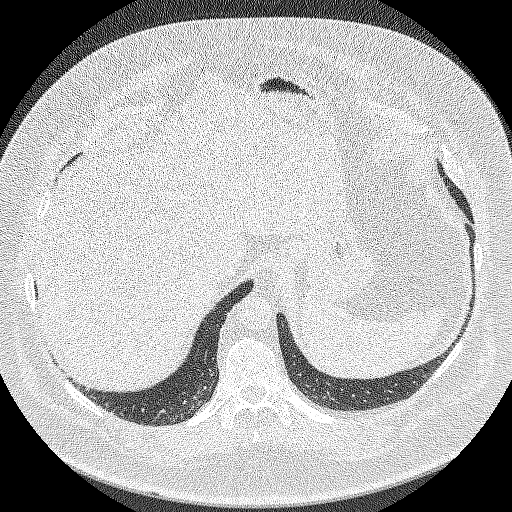
[im 95/317  lung]
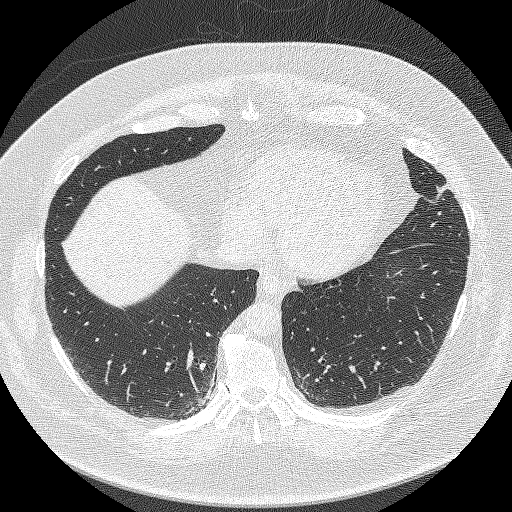
[im 127/317  mediastinal]
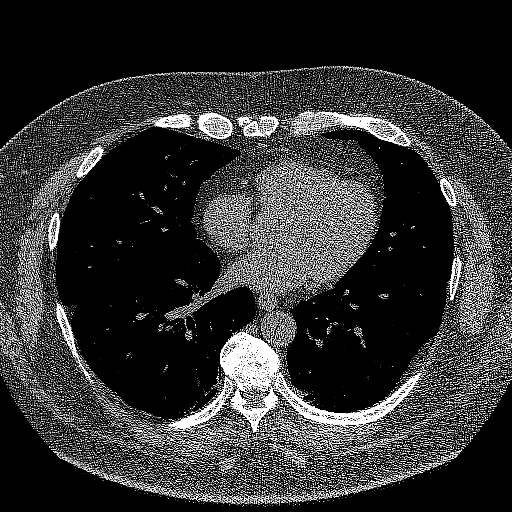
[im 127/317  lung]
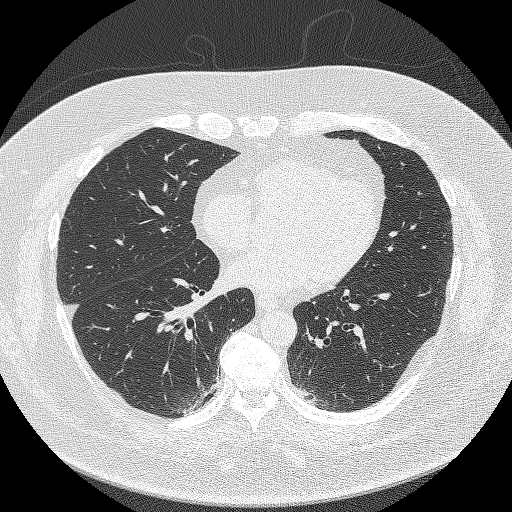
[im 143/317  lung]
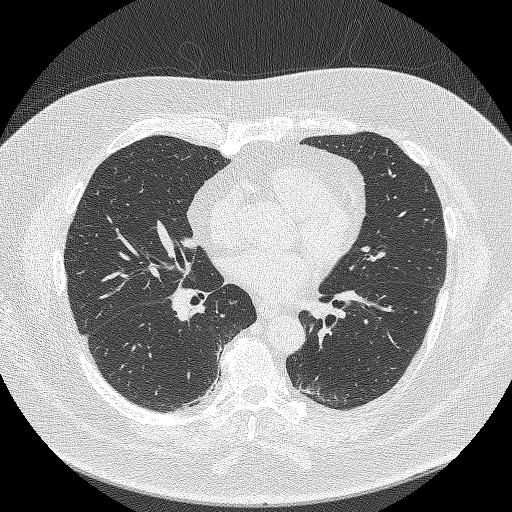
[im 174/317  lung]
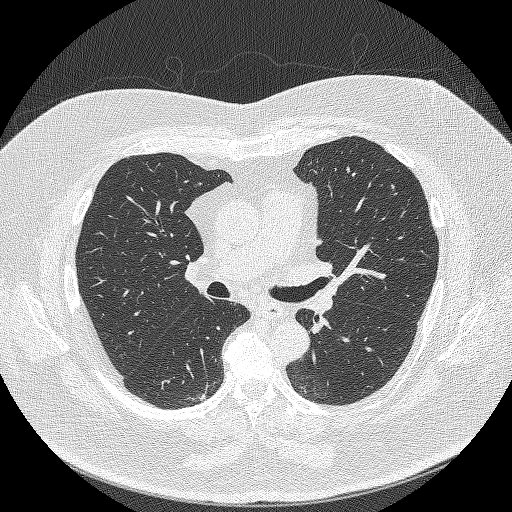
[im 190/317  lung]
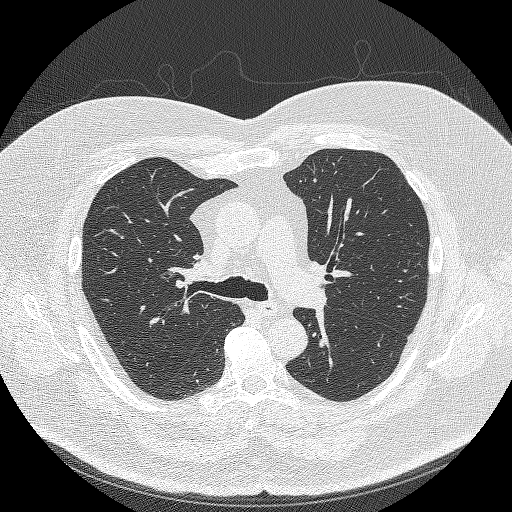
[im 222/317  mediastinal]
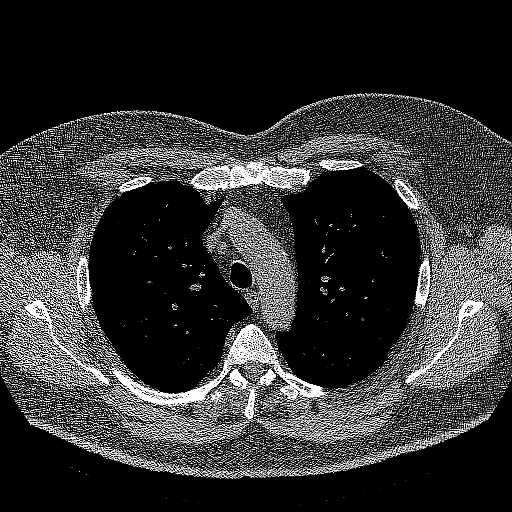
[im 222/317  lung]
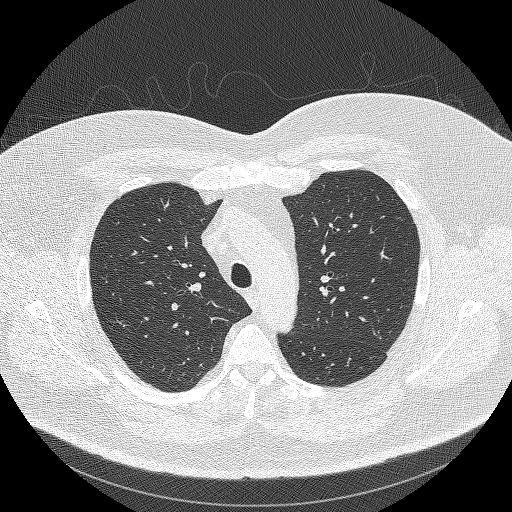
[im 253/317  lung]
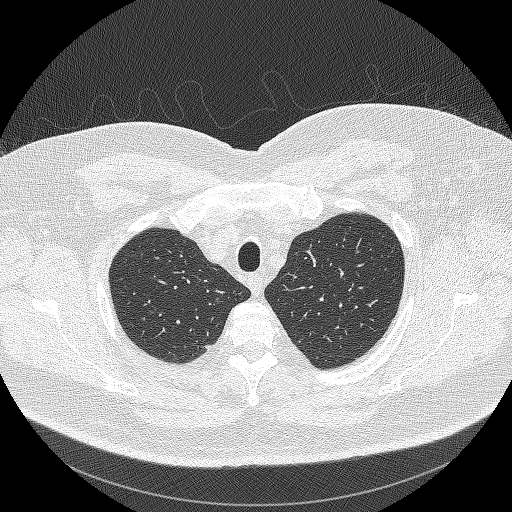
[im 269/317  lung]
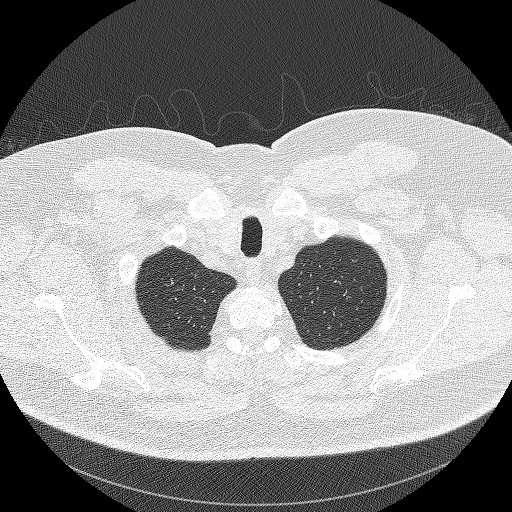
[im 301/317  lung]
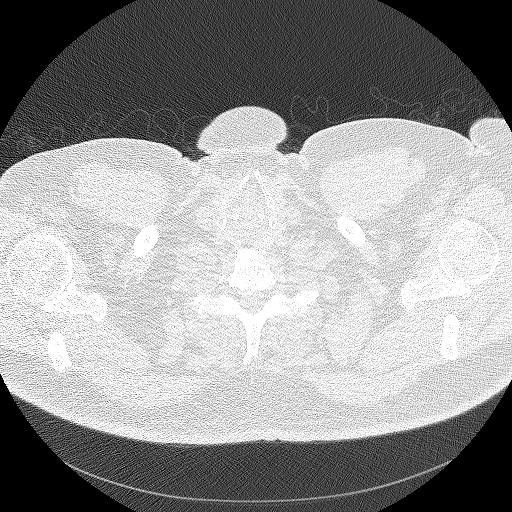

[Series 4: lung · coronal · 0.62mm/px · 3 of 374 slices shown (2 of 2)]
[im 75/374  lung]
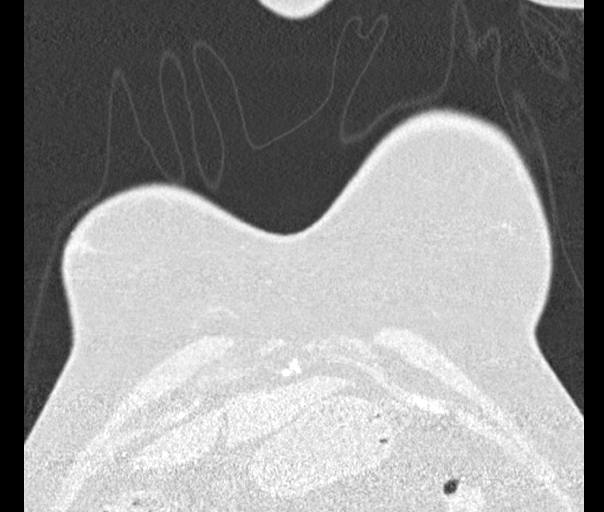
[im 150/374  lung]
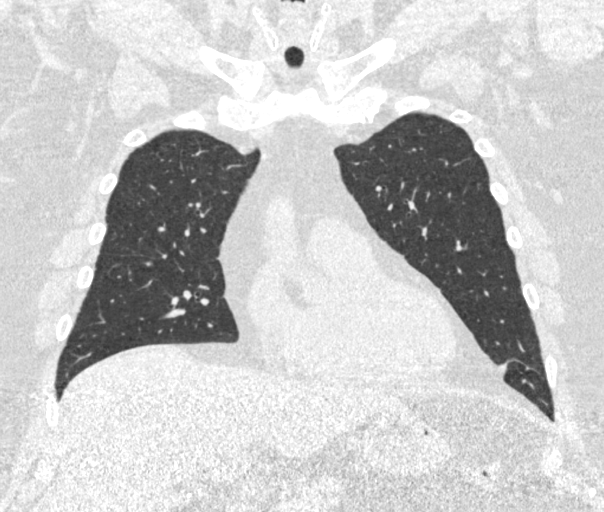
[im 224/374  lung]
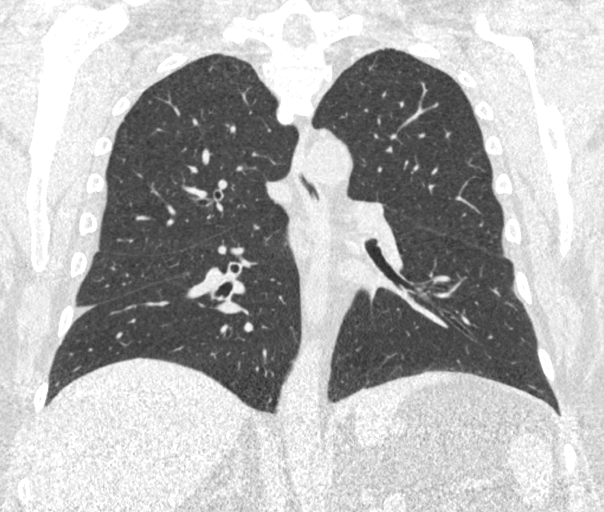

[15 of 40 positions shown; findings below may reference images not displayed]

FINDINGS: Cardiovascular: Normal heart size. No significant pericardial
effusion/thickening. Right coronary atherosclerosis. Atherosclerotic
nonaneurysmal thoracic aorta. Normal caliber pulmonary arteries.

Mediastinum/Nodes: Stable hypodense 1.2 cm left thyroid lobe nodule.
Unremarkable esophagus. No pathologically enlarged axillary,
mediastinal or hilar lymph nodes, noting limited sensitivity for the
detection of hilar adenopathy on this noncontrast study.

Lungs/Pleura: No pneumothorax. No pleural effusion. Mild
centrilobular emphysema. No acute consolidative airspace disease or
lung masses. No significant growth of any of the previously
visualized scattered small pulmonary nodules. No new significant
pulmonary nodules.

Upper abdomen: Cholecystectomy. Subcentimeter hypodense left liver
lobe lesion is too small to characterize and not appreciably
changed, considered benign. Surgical sutures noted from left
nephrectomy.

Musculoskeletal: No aggressive appearing focal osseous lesions.
Moderate thoracic spondylosis.
IMPRESSION: 1. Lung-RADS 2, benign appearance or behavior. Continue annual
screening with low-dose chest CT without contrast in 12 months.
2. One vessel coronary atherosclerosis.

Aortic Atherosclerosis (YPM4D-STR.R) and Emphysema (YPM4D-GM2.Z).

## 2019-04-16 ENCOUNTER — Ambulatory Visit: Payer: Medicare Other | Admitting: Urology

## 2019-05-23 ENCOUNTER — Telehealth: Payer: Self-pay | Admitting: *Deleted

## 2019-05-23 NOTE — Telephone Encounter (Signed)
Left message for patient to notify them that it is time to schedule annual low dose lung cancer screening CT scan. Instructed patient to call back to verify information prior to the scan being scheduled.  

## 2019-05-29 ENCOUNTER — Telehealth: Payer: Self-pay | Admitting: *Deleted

## 2019-05-29 DIAGNOSIS — Z122 Encounter for screening for malignant neoplasm of respiratory organs: Secondary | ICD-10-CM

## 2019-05-29 DIAGNOSIS — Z87891 Personal history of nicotine dependence: Secondary | ICD-10-CM

## 2019-05-29 NOTE — Telephone Encounter (Signed)
Patient has been notified that annual lung cancer screening low dose CT scan is due currently or will be in near future. Confirmed that patient is within the age range of 55-77, and asymptomatic, (no signs or symptoms of lung cancer). Patient denies illness that would prevent curative treatment for lung cancer if found. Verified smoking history, (former, quit 10/24/06, 32 pack year). The shared decision making visit was done 05/16/17. Patient is agreeable for CT scan being scheduled.

## 2019-05-29 NOTE — Telephone Encounter (Signed)
Left message for patient to notify them that it is time to schedule annual low dose lung cancer screening CT scan. Instructed patient to call back to verify information prior to the scan being scheduled.  

## 2019-06-05 ENCOUNTER — Encounter: Payer: Self-pay | Admitting: Urology

## 2019-06-05 ENCOUNTER — Ambulatory Visit
Admission: RE | Admit: 2019-06-05 | Discharge: 2019-06-05 | Disposition: A | Discharge status: Home / Self Care | Payer: Medicare Other | Source: Ambulatory Visit | Attending: Oncology | Admitting: Oncology

## 2019-06-05 ENCOUNTER — Other Ambulatory Visit: Payer: Self-pay

## 2019-06-05 ENCOUNTER — Ambulatory Visit (INDEPENDENT_AMBULATORY_CARE_PROVIDER_SITE_OTHER): Payer: Medicare Other | Admitting: Urology

## 2019-06-05 VITALS — BP 101/68 | HR 89 | Ht 68.0 in | Wt 257.0 lb

## 2019-06-05 DIAGNOSIS — Z122 Encounter for screening for malignant neoplasm of respiratory organs: Secondary | ICD-10-CM

## 2019-06-05 DIAGNOSIS — Z87891 Personal history of nicotine dependence: Secondary | ICD-10-CM | POA: Diagnosis present

## 2019-06-05 DIAGNOSIS — N183 Chronic kidney disease, stage 3 unspecified: Secondary | ICD-10-CM

## 2019-06-05 DIAGNOSIS — Z8546 Personal history of malignant neoplasm of prostate: Secondary | ICD-10-CM | POA: Diagnosis not present

## 2019-06-05 DIAGNOSIS — Z85528 Personal history of other malignant neoplasm of kidney: Secondary | ICD-10-CM

## 2019-06-05 NOTE — Progress Notes (Signed)
06/05/2019 8:31 AM   Joel George Aug 16, 1951 [id number]  Referring provider: Derinda Late, MD [phone] [facility] [place],  [place] [address]  Chief Complaint  Patient presents with  . Follow-up    HPI: 68 year old male with a personal history of prostate and kidney cancer who returns today for annual follow-up.  He was previously followed by Dr. Jacqlyn Larsen and seen once by Dr. Bernardo Heater last year.  He has a personal history of prostate cancer status post radical prostatectomy in 2001.  His PSA has been quite low since.  [facility] assay last year showed an undetectable PSA however the [facility] assay reports a PSA of 0.02 2015 and 0.03 in 02/2019.  He has mild stress incontinence.  Is not bothersome to him.  He does have baseline erectile dysfunction but not interested in further intervention.  He is overall pleased with the quality of life.  He also reports today that he has a history of renal cell carcinoma.  I do not of the pathology for this.  He underwent left nephrectomy 6 months following his prostatectomy.  He is followed by Dr. Juleen China his nephrologist.  He reports that his GFR went down to the high 20s but more recently rebounded to 33.  He had no recurrence of his renal cell carcinoma.  He underwent a renal ultrasound last year with no suspicious lesions.  He has no flank pain, weight loss, or any other concerns today.  He is voiding without difficulty.  No dysuria or gross hematuria.   PMH: Past Medical History:  Diagnosis Date  . Cancer ([place])    renal cancer,prostate cancer  . Chronic kidney disease    stage 3  . Depression   . Diabetes mellitus without complication ([place])   . GERD (gastroesophageal reflux disease)   . Gout   . History of kidney cancer   . Hyperlipidemia   . Hypertension   . TIA (transient ischemic attack)     Surgical History: Past Surgical History:  Procedure Laterality Date  . CATARACT EXTRACTION    .  CHOLECYSTECTOMY    . COLONOSCOPY WITH PROPOFOL N/A 09/07/2018   Procedure: COLONOSCOPY WITH PROPOFOL;  Surgeon: Lollie Sails, MD;  Location: [facility] ENDOSCOPY;  Service: Endoscopy;  Laterality: N/A;  . KIDNEY SURGERY  2001  . PROSTATECTOMY      Home Medications:  Allergies as of 06/05/2019   No Known Allergies     Medication List       Accurate as of June 05, 2019 11:59 PM. If you have any questions, ask your nurse or doctor.        aspirin EC 81 MG tablet Take 81 mg by mouth daily.   atorvastatin 20 MG tablet Commonly known as: LIPITOR   calcium-vitamin D 500-200 MG-UNIT Tabs tablet Commonly known as: OSCAL WITH D Take by mouth.   CHOLECALCIFEROL PO Take 1,000 Units by mouth daily.   famotidine 20 MG tablet Commonly known as: PEPCID Take by mouth.   glimepiride 2 MG tablet Commonly known as: AMARYL   lisinopril 5 MG tablet Commonly known as: ZESTRIL   Precision QID Test test strip Generic drug: glucose blood   ranitidine 150 MG tablet Commonly known as: ZANTAC Take 150 mg by mouth 2 (two) times daily.       Allergies: No Known Allergies  Family History: Family History  Problem Relation Age of Onset  . Parkinson's disease Father   . Kidney failure  Mother     Social History:  reports that he quit smoking about 12 years ago. He has a 32.00 pack-year smoking history. He has never used smokeless tobacco. He reports current alcohol use. He reports that he does not use drugs.  ROS: UROLOGY Frequent Urination?: Yes Hard to postpone urination?: No Burning/pain with urination?: No Get up at night to urinate?: No Leakage of urine?: No Urine stream starts and stops?: No Trouble starting stream?: No Do you have to strain to urinate?: No Blood in urine?: No Urinary tract infection?: No Sexually transmitted disease?: No Injury to kidneys or bladder?: No Painful intercourse?: No Weak stream?: No Erection problems?: No Penile pain?: No   Gastrointestinal Nausea?: No Vomiting?: No Indigestion/heartburn?: No Diarrhea?: No Constipation?: No  Constitutional Fever: No Night sweats?: No Weight loss?: No Fatigue?: No  Skin Skin rash/lesions?: No Itching?: No  Eyes Blurred vision?: No Double vision?: No  Ears/Nose/Throat Sore throat?: No Sinus problems?: No  Hematologic/Lymphatic Swollen glands?: No Easy bruising?: No  Cardiovascular Leg swelling?: No Chest pain?: No  Respiratory Cough?: No Shortness of breath?: No  Endocrine Excessive thirst?: No  Musculoskeletal Back pain?: No Joint pain?: No  Neurological Headaches?: No Dizziness?: No  Psychologic Depression?: No Anxiety?: No  Physical Exam: BP 101/68   Pulse 89   Ht 5\' 8"  (1.727 m)   Wt 257 lb (116.6 kg)   BMI 39.08 kg/m   Constitutional:  Alert and oriented, No acute distress. HEENT: [place] AT, moist mucus membranes.  Trachea midline, no masses. Cardiovascular: No clubbing, cyanosis, or edema. Respiratory: Normal respiratory effort, no increased work of breathing. GI: Abdomen is soft, nontender, nondistended, obese Skin: No rashes, bruises or suspicious lesions. Neurologic: Grossly intact, no focal deficits, moving all 4 extremities. Psychiatric: Normal mood and affect.  Laboratory Data: PSA as above  Creatinine 2.0 on 02/2019   Pertinent Imaging: Results for orders placed during the hospital encounter of 04/24/18  Ultrasound renal complete   Narrative CLINICAL DATA:  68 year old male with history of LEFT renal cell carcinoma and LEFT nephrectomy. Follow-up RIGHT kidney.  EXAM: RENAL / URINARY TRACT ULTRASOUND COMPLETE  COMPARISON:  07/22/2015 ultrasound  FINDINGS: Right Kidney:  Length: 12.7 cm. Increased renal echogenicity noted with cortical thinning. Scattered benign cysts are present, the largest measuring 2.8 cm extending off of the mid RIGHT kidney. There is no evidence of solid mass or hydronephrosis.  Left  Kidney:  Not visualized compatible with nephrectomy  Bladder:  Appears normal for degree of bladder distention.  IMPRESSION: 1. No evidence of suspicious RIGHT renal mass or hydronephrosis. Benign RIGHT renal cysts again noted. 2. Increased RIGHT renal echogenicity with cortical thinning - question chronic medical renal disease. 3. Status post LEFT nephrectomy.   Electronically Signed   By: Margarette Canada M.D.   On: 04/24/2018 22:56     Assessment & Plan:    1. History of prostate cancer PSA has remained essentially undetectable now for nearly 20 years which is reassuring We discussed the various between assays at [facility] versus [facility] including the sensitivity of each of the test Most recent PSA at [facility] 0.03 which does not meet the criteria for biochemical recurrence defined as 0.2 I have encouraged him to have his PSA drawn by his primary care physician, Dr. Abran Richard off in December as scheduled and he will let us know the value to continue to follow him We will continue to follow him annually per patient's request - PSA  2. History of renal cell  cancer No evidence of disease Given that it is been nearly 20 years since his nephrectomy, risk of recurrence is extremely low No further surveillance is indicated  3. CKD (chronic kidney disease) stage 3, GFR 30-59 ml/min (HCC) Followed by Dr. Juleen China   Follow-up in 1 year  Hollice Espy, MD  [place] [address] [place], [place] [address] [phone]

## 2019-06-07 ENCOUNTER — Encounter: Payer: Self-pay | Admitting: *Deleted

## 2019-09-26 DIAGNOSIS — J439 Emphysema, unspecified: Secondary | ICD-10-CM | POA: Insufficient documentation

## 2019-09-26 DIAGNOSIS — I700 Atherosclerosis of aorta: Secondary | ICD-10-CM | POA: Insufficient documentation

## 2019-09-26 DIAGNOSIS — E1122 Type 2 diabetes mellitus with diabetic chronic kidney disease: Secondary | ICD-10-CM | POA: Insufficient documentation

## 2019-10-28 DIAGNOSIS — E1122 Type 2 diabetes mellitus with diabetic chronic kidney disease: Secondary | ICD-10-CM | POA: Insufficient documentation

## 2019-10-28 DIAGNOSIS — M109 Gout, unspecified: Secondary | ICD-10-CM | POA: Insufficient documentation

## 2019-10-28 DIAGNOSIS — I129 Hypertensive chronic kidney disease with stage 1 through stage 4 chronic kidney disease, or unspecified chronic kidney disease: Secondary | ICD-10-CM | POA: Insufficient documentation

## 2019-10-28 DIAGNOSIS — E875 Hyperkalemia: Secondary | ICD-10-CM | POA: Insufficient documentation

## 2020-05-26 ENCOUNTER — Telehealth: Payer: Self-pay

## 2020-05-26 NOTE — Telephone Encounter (Signed)
Message left notifying patient that it is time to schedule the low dose lung cancer screening CT scan.  Instructed patient to return call to Shawn Perkins at [phone] to verify information prior to CT scan being scheduled.    

## 2020-05-27 ENCOUNTER — Other Ambulatory Visit: Payer: Self-pay | Admitting: *Deleted

## 2020-05-27 DIAGNOSIS — Z122 Encounter for screening for malignant neoplasm of respiratory organs: Secondary | ICD-10-CM

## 2020-05-27 DIAGNOSIS — Z87891 Personal history of nicotine dependence: Secondary | ICD-10-CM

## 2020-06-09 ENCOUNTER — Ambulatory Visit: Payer: Medicare Other | Admitting: Urology

## 2020-06-10 ENCOUNTER — Other Ambulatory Visit: Payer: Self-pay

## 2020-06-10 ENCOUNTER — Ambulatory Visit
Admission: RE | Admit: 2020-06-10 | Discharge: 2020-06-10 | Disposition: A | Discharge status: Home / Self Care | Payer: Medicare Other | Source: Ambulatory Visit | Attending: Oncology | Admitting: Oncology

## 2020-06-10 DIAGNOSIS — Z122 Encounter for screening for malignant neoplasm of respiratory organs: Secondary | ICD-10-CM | POA: Diagnosis present

## 2020-06-10 DIAGNOSIS — Z87891 Personal history of nicotine dependence: Secondary | ICD-10-CM | POA: Diagnosis present

## 2020-06-15 ENCOUNTER — Encounter: Payer: Self-pay | Admitting: *Deleted

## 2021-01-05 DIAGNOSIS — N184 Chronic kidney disease, stage 4 (severe): Secondary | ICD-10-CM | POA: Insufficient documentation

## 2021-09-23 ENCOUNTER — Encounter: Payer: Self-pay | Admitting: *Deleted

## 2021-09-24 ENCOUNTER — Encounter
Admission: RE | Disposition: A | Discharge status: Home / Self Care | Payer: Self-pay | Source: Home / Self Care | Attending: Gastroenterology

## 2021-09-24 ENCOUNTER — Ambulatory Visit
Admission: RE | Admit: 2021-09-24 | Discharge: 2021-09-24 | Disposition: A | Discharge status: Home / Self Care | Payer: Medicare Other | Attending: Gastroenterology | Admitting: Gastroenterology

## 2021-09-24 ENCOUNTER — Ambulatory Visit: Payer: Medicare Other | Admitting: Certified Registered Nurse Anesthetist

## 2021-09-24 DIAGNOSIS — J449 Chronic obstructive pulmonary disease, unspecified: Secondary | ICD-10-CM | POA: Diagnosis not present

## 2021-09-24 DIAGNOSIS — Z6838 Body mass index (BMI) 38.0-38.9, adult: Secondary | ICD-10-CM | POA: Insufficient documentation

## 2021-09-24 DIAGNOSIS — K640 First degree hemorrhoids: Secondary | ICD-10-CM | POA: Diagnosis not present

## 2021-09-24 DIAGNOSIS — Z87891 Personal history of nicotine dependence: Secondary | ICD-10-CM | POA: Diagnosis not present

## 2021-09-24 DIAGNOSIS — E669 Obesity, unspecified: Secondary | ICD-10-CM | POA: Insufficient documentation

## 2021-09-24 DIAGNOSIS — Z86010 Personal history of colonic polyps: Secondary | ICD-10-CM | POA: Diagnosis not present

## 2021-09-24 DIAGNOSIS — K219 Gastro-esophageal reflux disease without esophagitis: Secondary | ICD-10-CM | POA: Diagnosis not present

## 2021-09-24 DIAGNOSIS — I10 Essential (primary) hypertension: Secondary | ICD-10-CM | POA: Diagnosis not present

## 2021-09-24 DIAGNOSIS — I2510 Atherosclerotic heart disease of native coronary artery without angina pectoris: Secondary | ICD-10-CM | POA: Insufficient documentation

## 2021-09-24 DIAGNOSIS — K635 Polyp of colon: Secondary | ICD-10-CM | POA: Diagnosis not present

## 2021-09-24 DIAGNOSIS — K5730 Diverticulosis of large intestine without perforation or abscess without bleeding: Secondary | ICD-10-CM | POA: Insufficient documentation

## 2021-09-24 HISTORY — PX: COLONOSCOPY WITH PROPOFOL: SHX5780

## 2021-09-24 HISTORY — DX: Atherosclerotic heart disease of native coronary artery without angina pectoris: I25.10

## 2021-09-24 HISTORY — DX: Chronic obstructive pulmonary disease, unspecified: J44.9

## 2021-09-24 LAB — GLUCOSE, CAPILLARY: Glucose-Capillary: 96 mg/dL (ref 70–99)

## 2021-09-24 SURGERY — COLONOSCOPY WITH PROPOFOL
Anesthesia: General

## 2021-09-24 MED ORDER — SODIUM CHLORIDE 0.9 % IV SOLN
INTRAVENOUS | Status: DC
  Administered 2021-09-24: 20 mL/h via INTRAVENOUS

## 2021-09-24 MED ORDER — LIDOCAINE HCL (CARDIAC) PF 100 MG/5ML IV SOSY
PREFILLED_SYRINGE | INTRAVENOUS | Status: DC | PRN
  Administered 2021-09-24: 50 mg via INTRAVENOUS

## 2021-09-24 MED ORDER — PROPOFOL 10 MG/ML IV BOLUS
INTRAVENOUS | Status: DC | PRN
  Administered 2021-09-24: 50 mg via INTRAVENOUS

## 2021-09-24 MED ORDER — PROPOFOL 500 MG/50ML IV EMUL
INTRAVENOUS | Status: DC | PRN
  Administered 2021-09-24: 80 ug/kg/min via INTRAVENOUS

## 2021-09-24 MED ORDER — STERILE WATER FOR IRRIGATION IR SOLN
Status: DC | PRN
  Administered 2021-09-24: 120 mL

## 2021-09-24 NOTE — Interval H&P Note (Signed)
History and Physical Interval Note:  09/24/2021 10:33 AM  Joel George  has presented today for surgery, with the diagnosis of H/O adenomatous polyps.  The various methods of treatment have been discussed with the patient and family. After consideration of risks, benefits and other options for treatment, the patient has consented to  Procedure(s) with comments: COLONOSCOPY WITH PROPOFOL (N/A) - DM as a surgical intervention.  The patient's history has been reviewed, patient examined, no change in status, stable for surgery.  I have reviewed the patient's chart and labs.  Questions were answered to the patient's satisfaction.     Lesly Rubenstein  Ok to proceed with colonoscopy

## 2021-09-24 NOTE — Op Note (Signed)
[facility] Gastroenterology Patient Name: Joel George Procedure Date: 09/24/2021 10:29 AM MRN: [id number] Account #: [id number] Date of Birth: Aug 27, 1951 Admit Type: Outpatient Age: 70 Room: [facility] ENDO ROOM 3 Gender: Male Note Status: Finalized Instrument Name: Jasper Riling [id number] Procedure:             Colonoscopy Indications:           Surveillance: Personal history of adenomatous polyps                         on last colonoscopy 3 years ago Providers:             Andrey Farmer MD, MD Referring MD:          Caprice Renshaw MD (Referring MD) Medicines:             Monitored Anesthesia Care Complications:         No immediate complications. Estimated blood loss:                         Minimal. Procedure:             Pre-Anesthesia Assessment:                        - Prior to the procedure, a History and Physical was                         performed, and patient medications and allergies were                         reviewed. The patient is competent. The risks and                         benefits of the procedure and the sedation options and                         risks were discussed with the patient. All questions                         were answered and informed consent was obtained.                         Patient identification and proposed procedure were                         verified by the physician, the nurse, the anesthetist                         and the technician in the endoscopy suite. Mental                         Status Examination: alert and oriented. Airway                         Examination: normal oropharyngeal airway and neck                         mobility. Respiratory Examination: clear to  auscultation. CV Examination: normal. Prophylactic                         Antibiotics: The patient does not require prophylactic                         antibiotics. Prior Anticoagulants: The patient has                          taken no previous anticoagulant or antiplatelet agents                         except for aspirin. ASA Grade Assessment: III - A                         patient with severe systemic disease. After reviewing                         the risks and benefits, the patient was deemed in                         satisfactory condition to undergo the procedure. The                         anesthesia plan was to use monitored anesthesia care                         (MAC). Immediately prior to administration of                         medications, the patient was re-assessed for adequacy                         to receive sedatives. The heart rate, respiratory                         rate, oxygen saturations, blood pressure, adequacy of                         pulmonary ventilation, and response to care were                         monitored throughout the procedure. The physical                         status of the patient was re-assessed after the                         procedure.                        After obtaining informed consent, the colonoscope was                         passed under direct vision. Throughout the procedure,                         the patient's blood pressure, pulse, and oxygen  saturations were monitored continuously. The                         Colonoscope was introduced through the anus and                         advanced to the the cecum, identified by appendiceal                         orifice and ileocecal valve. The colonoscopy was                         performed without difficulty. The patient tolerated                         the procedure well. The quality of the bowel                         preparation was adequate to identify polyps. Findings:      The perianal and digital rectal examinations were normal.      A 3 mm polyp was found in the ascending colon. The polyp was sessile.       The polyp was removed with a cold  snare. Resection and retrieval were       complete. Estimated blood loss was minimal.      A 4 mm polyp was found in the transverse colon. The polyp was sessile.       The polyp was removed with a cold snare. Resection and retrieval were       complete. Estimated blood loss was minimal.      Multiple small-mouthed diverticula were found in the sigmoid colon,       descending colon and transverse colon.      Internal hemorrhoids were found during retroflexion. The hemorrhoids       were Grade I (internal hemorrhoids that do not prolapse).      The exam was otherwise without abnormality on direct and retroflexion       views. Impression:            - One 3 mm polyp in the ascending colon, removed with                         a cold snare. Resected and retrieved.                        - One 4 mm polyp in the transverse colon, removed with                         a cold snare. Resected and retrieved.                        - Diverticulosis in the sigmoid colon, in the                         descending colon and in the transverse colon.                        - Internal hemorrhoids.                        -  The examination was otherwise normal on direct and                         retroflexion views. Recommendation:        - Discharge patient to home.                        - Resume previous diet.                        - Continue present medications.                        - Await pathology results.                        - Repeat colonoscopy for surveillance based on                         pathology results.                        - Return to referring physician as previously                         scheduled. Procedure Code(s):     --- Professional ---                        [phone], Colonoscopy, flexible; with removal of                         tumor(s), polyp(s), or other lesion(s) by snare                         technique Diagnosis Code(s):     --- Professional ---                         Z86.010, Personal history of colonic polyps                        K63.5, Polyp of colon                        K64.0, First degree hemorrhoids                        K57.30, Diverticulosis of large intestine without                         perforation or abscess without bleeding CPT copyright 2019 [employer]. All rights reserved. The codes documented in this report are preliminary and upon coder review may  be revised to meet current compliance requirements. Andrey Farmer MD, MD 09/24/2021 11:03:12 AM Number of Addenda: 0 Note Initiated On: 09/24/2021 10:29 AM Scope Withdrawal Time: 0 hours 10 minutes 54 seconds  Total Procedure Duration: 0 hours 16 minutes 2 seconds  Estimated Blood Loss:  Estimated blood loss was minimal.      [facility]

## 2021-09-24 NOTE — H&P (Signed)
Outpatient short stay form Pre-procedure 09/24/2021  Lesly Rubenstein, MD  Primary Physician: Derinda Late, MD  Reason for visit:  Surveillance colonoscopy  History of present illness:   70 y/o gentleman with history of obesity, hypertension, and DM II here for surveillance colonoscopy as he had > 3 Ta's on last colonoscopy done 3 years ago. No family history of GI malignancies. No blood thinners. History of prostatectomy, cholecystectomy, and nephrectomy.    Current Facility-Administered Medications:    0.9 %  sodium chloride infusion, , Intravenous, Continuous, Khailee Mick, Hilton Cork, MD, Last Rate: 20 mL/hr at 09/24/21 1030, 20 mL/hr at 09/24/21 1030  Medications Prior to Admission  Medication Sig Dispense Refill Last Dose   aspirin EC 81 MG tablet Take 81 mg by mouth daily.   Past Week   atorvastatin (LIPITOR) 20 MG tablet    Past Week   calcium-vitamin D (OSCAL WITH D) 500-200 MG-UNIT TABS tablet Take by mouth.   Past Week   CHOLECALCIFEROL PO Take 1,000 Units by mouth daily.   Past Week   famotidine (PEPCID) 20 MG tablet Take by mouth.   Past Week   glimepiride (AMARYL) 2 MG tablet    Past Week   glucose blood test strip    Past Week   lisinopril (PRINIVIL,ZESTRIL) 5 MG tablet    Past Week   ranitidine (ZANTAC) 150 MG tablet Take 150 mg by mouth 2 (two) times daily.   Past Week     No Known Allergies   Past Medical History:  Diagnosis Date   Cancer ([place])    renal cancer,prostate cancer   Chronic kidney disease    stage 3   COPD (chronic obstructive pulmonary disease) (HCC)    Coronary artery disease    Depression    Diabetes mellitus without complication (HCC)    GERD (gastroesophageal reflux disease)    Gout    History of kidney cancer    Hyperlipidemia    Hypertension    TIA (transient ischemic attack)     Review of systems:  Otherwise negative.    Physical Exam  Gen: Alert, oriented. Appears stated age.  HEENT: PERRLA. Lungs: No respiratory  distress CV: RRR Abd: soft, benign, no masses Ext: No edema    Planned procedures: Proceed with colonoscopy. The patient understands the nature of the planned procedure, indications, risks, alternatives and potential complications including but not limited to bleeding, infection, perforation, damage to internal organs and possible oversedation/side effects from anesthesia. The patient agrees and gives consent to proceed.  Please refer to procedure notes for findings, recommendations and patient disposition/instructions.     Lesly Rubenstein, MD [facility]

## 2021-09-24 NOTE — Anesthesia Preprocedure Evaluation (Signed)
Anesthesia Evaluation  Patient identified by MRN, date of birth, ID band Patient awake    Reviewed: Allergy & Precautions, NPO status , Patient's Chart, lab work & pertinent test results  Airway Mallampati: II  TM Distance: >3 FB Neck ROM: Full    Dental  (+) Teeth Intact   Pulmonary neg pulmonary ROS, COPD, former smoker,    Pulmonary exam normal  + decreased breath sounds      Cardiovascular Exercise Tolerance: Good hypertension, Pt. on medications + CAD  negative cardio ROS Normal cardiovascular exam Rhythm:Regular     Neuro/Psych Depression TIAnegative neurological ROS  negative psych ROS   GI/Hepatic negative GI ROS, Neg liver ROS, GERD  ,  Endo/Other  negative endocrine ROSdiabetes, Well Controlled, Type 2  Renal/GU Renal diseasenegative Renal ROSSp nephrectomy left  negative genitourinary   Musculoskeletal negative musculoskeletal ROS (+)   Abdominal (+) + obese,   Peds negative pediatric ROS (+)  Hematology negative hematology ROS (+)   Anesthesia Other Findings Past Medical History: No date: Cancer ([place])     Comment:  renal cancer,prostate cancer No date: Chronic kidney disease     Comment:  stage 3 No date: COPD (chronic obstructive pulmonary disease) (HCC) No date: Coronary artery disease No date: Depression No date: Diabetes mellitus without complication (HCC) No date: GERD (gastroesophageal reflux disease) No date: Gout No date: History of kidney cancer No date: Hyperlipidemia No date: Hypertension No date: TIA (transient ischemic attack)  Past Surgical History: No date: CATARACT EXTRACTION No date: CHOLECYSTECTOMY 09/07/2018: COLONOSCOPY WITH PROPOFOL; N/A     Comment:  Procedure: COLONOSCOPY WITH PROPOFOL;  Surgeon:               Lollie Sails, MD;  Location: [facility] ENDOSCOPY;                Service: Endoscopy;  Laterality: N/A; No date: DIAGNOSTIC LAPAROSCOPY No date: EYE  SURGERY 2001: KIDNEY SURGERY No date: PROSTATECTOMY  BMI    Body Mass Index: 38.92 kg/m      Reproductive/Obstetrics negative OB ROS                             Anesthesia Physical Anesthesia Plan  ASA: 4  Anesthesia Plan: General   Post-op Pain Management:    Induction: Intravenous  PONV Risk Score and Plan: Propofol infusion and TIVA  Airway Management Planned: Natural Airway and Nasal Cannula  Additional Equipment:   Intra-op Plan:   Post-operative Plan:   Informed Consent: I have reviewed the patients History and Physical, chart, labs and discussed the procedure including the risks, benefits and alternatives for the proposed anesthesia with the patient or authorized representative who has indicated his/her understanding and acceptance.     Dental Advisory Given  Plan Discussed with: Anesthesiologist, CRNA and Surgeon  Anesthesia Plan Comments:         Anesthesia Quick Evaluation

## 2021-09-24 NOTE — Transfer of Care (Signed)
Immediate Anesthesia Transfer of Care Note  Patient: LEMMIE VANLANEN  Procedure(s) Performed: COLONOSCOPY WITH PROPOFOL  Patient Location: PACU  Anesthesia Type:General  Level of Consciousness: awake, alert  and oriented  Airway & Oxygen Therapy: Patient Spontanous Breathing and Patient connected to nasal cannula oxygen  Post-op Assessment: Report given to RN and Post -op Vital signs reviewed and stable  Post vital signs: Reviewed and stable  Last Vitals:  Vitals Value Taken Time  BP    Temp    Pulse    Resp    SpO2      Last Pain:  Vitals:   09/24/21 1014  TempSrc: Temporal  PainSc: 0-No pain         Complications: No notable events documented.

## 2021-09-27 ENCOUNTER — Encounter: Payer: Self-pay | Admitting: Gastroenterology

## 2021-09-28 LAB — SURGICAL PATHOLOGY

## 2021-10-08 NOTE — Anesthesia Postprocedure Evaluation (Signed)
Anesthesia Post Note  Patient: Joel George  Procedure(s) Performed: COLONOSCOPY WITH PROPOFOL  Patient location during evaluation: PACU Anesthesia Type: General Level of consciousness: awake and awake and alert Pain management: pain level controlled Vital Signs Assessment: post-procedure vital signs reviewed and stable Respiratory status: spontaneous breathing and nonlabored ventilation Cardiovascular status: blood pressure returned to baseline Postop Assessment: no headache Anesthetic complications: no   No notable events documented.   Last Vitals:  Vitals:   09/24/21 1014 09/24/21 1133  BP: 130/73 121/69  Pulse: 90   Resp: 20   Temp: (!) 36.1 C   SpO2: 98%     Last Pain:  Vitals:   09/25/21 0931  TempSrc:   PainSc: 0-No pain                 VAN STAVEREN,Huy Majid

## 2021-11-30 ENCOUNTER — Other Ambulatory Visit: Payer: Self-pay | Admitting: *Deleted

## 2021-11-30 DIAGNOSIS — Z87891 Personal history of nicotine dependence: Secondary | ICD-10-CM

## 2021-12-21 ENCOUNTER — Ambulatory Visit
Admission: RE | Admit: 2021-12-21 | Discharge: 2021-12-21 | Disposition: A | Discharge status: Home / Self Care | Payer: Medicare Other | Source: Ambulatory Visit | Attending: Acute Care | Admitting: Acute Care

## 2021-12-21 ENCOUNTER — Other Ambulatory Visit: Payer: Self-pay

## 2021-12-21 DIAGNOSIS — Z87891 Personal history of nicotine dependence: Secondary | ICD-10-CM | POA: Insufficient documentation

## 2021-12-23 ENCOUNTER — Other Ambulatory Visit: Payer: Self-pay | Admitting: Acute Care

## 2021-12-23 DIAGNOSIS — Z87891 Personal history of nicotine dependence: Secondary | ICD-10-CM

## 2022-11-10 DIAGNOSIS — I10 Essential (primary) hypertension: Secondary | ICD-10-CM | POA: Insufficient documentation

## 2022-11-10 DIAGNOSIS — Z905 Acquired absence of kidney: Secondary | ICD-10-CM | POA: Insufficient documentation

## 2022-12-22 ENCOUNTER — Ambulatory Visit
Admission: RE | Admit: 2022-12-22 | Discharge: 2022-12-22 | Disposition: A | Discharge status: Home / Self Care | Payer: Medicare Other | Source: Ambulatory Visit | Attending: Acute Care | Admitting: Acute Care

## 2022-12-22 DIAGNOSIS — I6521 Occlusion and stenosis of right carotid artery: Secondary | ICD-10-CM | POA: Diagnosis not present

## 2022-12-22 DIAGNOSIS — J439 Emphysema, unspecified: Secondary | ICD-10-CM | POA: Diagnosis not present

## 2022-12-22 DIAGNOSIS — Z122 Encounter for screening for malignant neoplasm of respiratory organs: Secondary | ICD-10-CM | POA: Diagnosis not present

## 2022-12-22 DIAGNOSIS — Z87891 Personal history of nicotine dependence: Secondary | ICD-10-CM

## 2022-12-22 DIAGNOSIS — I2510 Atherosclerotic heart disease of native coronary artery without angina pectoris: Secondary | ICD-10-CM | POA: Diagnosis not present

## 2022-12-22 DIAGNOSIS — I700 Atherosclerosis of aorta: Secondary | ICD-10-CM | POA: Insufficient documentation

## 2023-02-09 DIAGNOSIS — D631 Anemia in chronic kidney disease: Secondary | ICD-10-CM | POA: Insufficient documentation

## 2023-02-09 DIAGNOSIS — N2581 Secondary hyperparathyroidism of renal origin: Secondary | ICD-10-CM | POA: Insufficient documentation

## 2023-02-09 DIAGNOSIS — R609 Edema, unspecified: Secondary | ICD-10-CM | POA: Insufficient documentation

## 2023-09-13 ENCOUNTER — Other Ambulatory Visit: Payer: Self-pay

## 2023-09-13 ENCOUNTER — Encounter: Payer: Self-pay | Admitting: Emergency Medicine

## 2023-09-13 ENCOUNTER — Emergency Department
Admission: EM | Admit: 2023-09-13 | Discharge: 2023-09-13 | Disposition: A | Discharge status: Home / Self Care | Payer: Medicare Other | Attending: Emergency Medicine | Admitting: Emergency Medicine

## 2023-09-13 ENCOUNTER — Emergency Department: Payer: Medicare Other

## 2023-09-13 DIAGNOSIS — E1122 Type 2 diabetes mellitus with diabetic chronic kidney disease: Secondary | ICD-10-CM | POA: Diagnosis not present

## 2023-09-13 DIAGNOSIS — N184 Chronic kidney disease, stage 4 (severe): Secondary | ICD-10-CM | POA: Insufficient documentation

## 2023-09-13 DIAGNOSIS — S0990XA Unspecified injury of head, initial encounter: Secondary | ICD-10-CM | POA: Insufficient documentation

## 2023-09-13 DIAGNOSIS — Y92511 Restaurant or cafe as the place of occurrence of the external cause: Secondary | ICD-10-CM | POA: Insufficient documentation

## 2023-09-13 DIAGNOSIS — I2510 Atherosclerotic heart disease of native coronary artery without angina pectoris: Secondary | ICD-10-CM | POA: Diagnosis not present

## 2023-09-13 DIAGNOSIS — W228XXA Striking against or struck by other objects, initial encounter: Secondary | ICD-10-CM | POA: Insufficient documentation

## 2023-09-13 DIAGNOSIS — J449 Chronic obstructive pulmonary disease, unspecified: Secondary | ICD-10-CM | POA: Diagnosis not present

## 2023-09-13 DIAGNOSIS — I129 Hypertensive chronic kidney disease with stage 1 through stage 4 chronic kidney disease, or unspecified chronic kidney disease: Secondary | ICD-10-CM | POA: Diagnosis not present
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
## 2023-09-13 NOTE — Discharge Instructions (Addendum)
Your CT scan of your head was normal today.  You can take Tylenol as needed for headaches.  Return to the ED for any new or worsening symptoms like multiple episodes of vomiting, confusion, dizziness, or headache you cannot control with Tylenol.
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
## 2023-09-13 NOTE — ED Notes (Signed)
Pt to CT

## 2024-09-12 ENCOUNTER — Other Ambulatory Visit: Payer: Self-pay

## 2024-09-12 ENCOUNTER — Emergency Department
Admission: EM | Admit: 2024-09-12 | Discharge: 2024-09-12 | Disposition: A | Discharge status: Home / Self Care | Attending: Emergency Medicine | Admitting: Emergency Medicine

## 2024-09-12 ENCOUNTER — Encounter: Payer: Self-pay | Admitting: Emergency Medicine

## 2024-09-12 ENCOUNTER — Emergency Department

## 2024-09-12 DIAGNOSIS — X58XXXA Exposure to other specified factors, initial encounter: Secondary | ICD-10-CM | POA: Insufficient documentation

## 2024-09-12 DIAGNOSIS — I2510 Atherosclerotic heart disease of native coronary artery without angina pectoris: Secondary | ICD-10-CM | POA: Diagnosis not present

## 2024-09-12 DIAGNOSIS — N189 Chronic kidney disease, unspecified: Secondary | ICD-10-CM | POA: Insufficient documentation

## 2024-09-12 DIAGNOSIS — E1122 Type 2 diabetes mellitus with diabetic chronic kidney disease: Secondary | ICD-10-CM | POA: Insufficient documentation

## 2024-09-12 DIAGNOSIS — S82432A Displaced oblique fracture of shaft of left fibula, initial encounter for closed fracture: Secondary | ICD-10-CM | POA: Diagnosis not present

## 2024-09-12 DIAGNOSIS — Z8673 Personal history of transient ischemic attack (TIA), and cerebral infarction without residual deficits: Secondary | ICD-10-CM | POA: Insufficient documentation

## 2024-09-12 DIAGNOSIS — M25572 Pain in left ankle and joints of left foot: Secondary | ICD-10-CM | POA: Diagnosis present

## 2024-09-12 DIAGNOSIS — I129 Hypertensive chronic kidney disease with stage 1 through stage 4 chronic kidney disease, or unspecified chronic kidney disease: Secondary | ICD-10-CM | POA: Insufficient documentation

## 2024-09-12 DIAGNOSIS — S82832A Other fracture of upper and lower end of left fibula, initial encounter for closed fracture: Secondary | ICD-10-CM

## 2024-09-12 DIAGNOSIS — Y9339 Activity, other involving climbing, rappelling and jumping off: Secondary | ICD-10-CM | POA: Diagnosis not present

## 2024-09-12 MED ORDER — HYDROCODONE-ACETAMINOPHEN 5-325 MG PO TABS
1.0000 | ORAL_TABLET | Freq: Once | ORAL | Status: AC
  Administered 2024-09-12: 1 via ORAL
  Filled 2024-09-12: qty 1

## 2024-09-12 MED ORDER — HYDROCODONE-ACETAMINOPHEN 5-325 MG PO TABS: 1.0000 | ORAL_TABLET | Freq: Four times a day (QID) | ORAL | 0 refills | Status: DC | PRN

## 2024-09-12 NOTE — Discharge Instructions (Addendum)
 Call make an appointment with Dr. Malvin who is on-call for podiatry.  His office number is listed on your discharge papers what when you call tell them that you want to be seen in the [place] office on [facility].  Ice and elevation to reduce swelling which will also help with pain.  Hydrocodone was sent to the pharmacy to take as needed for pain.  Be aware that this medication could cause drowsiness and increase your risk for injury.  Use the walker that you have for added protection and stability.  The cam boot should be worn until you are seen by the podiatrist.

## 2024-09-12 NOTE — ED Provider Notes (Signed)
 [facility] Provider Note    Event Date/Time   First MD Initiated Contact with Patient 09/12/24 1257     (approximate)   History   Ankle Pain   HPI  Joel George is a 73 y.o. male   presents to the ED with complaint of left ankle pain.  Patient states that he jumped over a fence and heard a snap.  He has had pain in his left ankle but states that both of them are sore.  He continues to be able to move his right lower extremity without any difficulty.  There was no head injury or loss of consciousness during this fall.  Patient has history of hypertension, diabetes, TIA, coronary artery disease, chronic kidney disease, depression, GERD.      Physical Exam   Triage Vital Signs: ED Triage Vitals [09/12/24 1223]  Encounter Vitals Group     BP 112/76     Girls Systolic BP Percentile      Girls Diastolic BP Percentile      Boys Systolic BP Percentile      Boys Diastolic BP Percentile      Pulse Rate 87     Resp 18     Temp 98 F (36.7 C)     Temp Source Oral     SpO2 97 %     Weight 225 lb (102.1 kg)     Height 5' 7 (1.702 m)     Head Circumference      Peak Flow      Pain Score 8     Pain Loc      Pain Education      Exclude from Growth Chart     Most recent vital signs: Vitals:   09/12/24 1223  BP: 112/76  Pulse: 87  Resp: 18  Temp: 98 F (36.7 C)  SpO2: 97%     General: Awake, no distress.  Alert, talkative, answers questions appropriately. CV:  Good peripheral perfusion.  Resp:  Normal effort.  Abd:  No distention.  Other:  Right lower extremity without deformity.  Patient is able to move without any difficulty and is able to flex and extend from his knee and ankle.  Skin is intact.  No effusion noted at the patella.  Pulses are present.  On examination of the left lower extremity there is moderate edema to the distal fibula.  Skin is intact.  Pulses present.  Motor or sensory function intact.  No tenderness on palpation of the  anterior tibia and knee area.   ED Results / Procedures / Treatments   Labs (all labs ordered are listed, but only abnormal results are displayed) Labs Reviewed - No data to display    RADIOLOGY Left ankle x-ray images were reviewed and interpreted by myself independent of the radiologist and was noted to be positive for a fracture of the distal fibula.  See radiology report which confirms fracture with minimal displacement and soft tissue edema.    PROCEDURES:  Critical Care performed:   Procedures   MEDICATIONS ORDERED IN ED: Medications  HYDROcodone -acetaminophen  (NORCO/VICODIN) 5-325 MG per tablet 1 tablet (1 tablet Oral Given 09/12/24 1351)     IMPRESSION / MDM / ASSESSMENT AND PLAN / ED COURSE  I reviewed the triage vital signs and the nursing notes.   Differential diagnosis includes, but is not limited to, contusion, sprain, fracture, dislocation secondary to fall.  73 year old male presents to the ED with complaint of left ankle pain  after a fall that occurred earlier today.  X-rays show a nondisplaced fracture of the distal fibula and patient was made aware.  He was placed in a long cam walker with instructions to ice and elevate.  A prescription for hydrocodone was sent to the pharmacy and he is aware that this could cause drowsiness and increase his risk for falling.  His wife has a walker with them that he is able to use.  He is instructed to follow-up with Dr. Malvin who is on-call for podiatry.  Return to the emergency department if any severe worsening of his symptoms over the weekend.      Patient's presentation is most consistent with acute illness / injury with system symptoms.  FINAL CLINICAL IMPRESSION(S) / ED DIAGNOSES   Final diagnoses:  Closed fracture of distal end of left fibula, unspecified fracture morphology, initial encounter     Rx / DC Orders   ED Discharge Orders          Ordered    HYDROcodone-acetaminophen (NORCO/VICODIN)  5-325 MG tablet  Every 6 hours PRN        09/12/24 1336             Note:  This document was prepared using Dragon voice recognition software and may include unintentional dictation errors.   Saunders Shona CROME, PA-C 09/12/24 1430    Arlander Charleston, MD 09/12/24 [phone]

## 2024-09-12 NOTE — ED Notes (Signed)
 Patient declined discharge vital signs.

## 2024-09-12 NOTE — ED Triage Notes (Signed)
 Pt to ER states he jumped over a fence and had pain to both legs, but worse to left ankle.  Pt states he heard some snaps and just wanted to get and xray to be sure.

## 2024-09-16 ENCOUNTER — Ambulatory Visit: Admitting: Podiatry

## 2024-09-16 DIAGNOSIS — S82832A Other fracture of upper and lower end of left fibula, initial encounter for closed fracture: Secondary | ICD-10-CM | POA: Diagnosis not present

## 2024-09-16 DIAGNOSIS — E785 Hyperlipidemia, unspecified: Secondary | ICD-10-CM | POA: Insufficient documentation

## 2024-09-16 DIAGNOSIS — N183 Chronic kidney disease, stage 3 unspecified: Secondary | ICD-10-CM | POA: Insufficient documentation

## 2024-09-16 DIAGNOSIS — Z8739 Personal history of other diseases of the musculoskeletal system and connective tissue: Secondary | ICD-10-CM | POA: Insufficient documentation

## 2024-09-16 DIAGNOSIS — E669 Obesity, unspecified: Secondary | ICD-10-CM | POA: Insufficient documentation

## 2024-09-16 MED ORDER — HYDROCODONE-ACETAMINOPHEN 5-325 MG PO TABS: 1.0000 | ORAL_TABLET | Freq: Two times a day (BID) | ORAL | 0 refills | Status: AC | PRN

## 2024-09-16 NOTE — Patient Instructions (Signed)
  VISIT SUMMARY: You visited today due to an ankle injury from a fall off a ladder. Your left ankle has a nondisplaced distal fibular fracture, which means the bone is broken but still in place. You have been managing the pain with hydrocodone  at night and Tylenol  during the day, along with ice to reduce swelling.  YOUR PLAN: -NONDISPLACED DISTAL FIBULAR FRACTURE, LEFT ANKLE: A nondisplaced distal fibular fracture means that the bone in your left ankle is broken but has not moved out of place. The plan is to continue with nonoperative treatment, which includes partial weight bearing on your left leg while using a cam walker boot and a walker for support. You should continue to use ice to manage pain and swelling, and elevate your ankle as needed. Your hydrocodone  prescription has been refilled for nighttime pain management. A follow-up appointment is scheduled in two weeks to check the healing progress with new X-rays. If the X-rays show any displacement or instability, surgery may be considered.  INSTRUCTIONS: Please follow up in two weeks for weight-bearing ankle radiographs to assess for any diastasis or displacement. Continue using the cam walker boot and walker, and manage pain with hydrocodone  at night and Tylenol  during the day. Apply ice and elevate your ankle as needed.                      Contains text generated by [employer]bridge.                                 Contains text generated by [employer]bridge.

## 2024-09-17 ENCOUNTER — Telehealth: Payer: Self-pay | Admitting: Podiatry

## 2024-09-17 NOTE — Telephone Encounter (Signed)
 Pt need a refill on pain medication went to Pharmacy and they said they haven't gotten a prescription for a refill for hydrocodone  best  contact [phone] please advise.

## 2024-09-17 NOTE — Progress Notes (Signed)
 Subjective:  Patient ID: Joel George, male    DOB: Aug 26, 1951,  MRN: [id number]  Chief Complaint  Patient presents with   Fracture    NP-Seen in ER 09/12/24: Closed fracture of distal end of left fibula, unspecified fracture morphology    Discussed the use of AI scribe software for clinical note transcription with the patient, who gave verbal consent to proceed.  History of Present Illness Joel George is a 73 year old male who presents with an ankle injury after a fall from a ladder.  He injured his left ankle after falling off a ladder. The pain is described as an ache and throb, with a severity of 5.5 to 6.5 out of 10. The pain is primarily located in the left ankle, with some trauma to both legs. He notes the pain is higher up, possibly involving muscles or ligaments, and he has been using a walker to assist with mobility, placing most of his weight on the right leg.  He manages the pain with hydrocodone , particularly at night to aid sleep, and has recently reduced his usage to Tylenol  during the day. He has about four or five hydrocodone  tablets left and uses ice to manage swelling and throbbing pain.  No issues with vitamin D deficiency or bone density are reported, and he takes vitamin D daily. He also mentions a tendency for his legs to swell, which he attributes to fluid retention and inactivity from being in a recliner. He maintains an active lifestyle to manage this condition.      Objective:    Physical Exam VASCULAR: DP and PT pulse palpable. Foot is warm and well-perfused. Capillary fill time is brisk. DERMATOLOGIC: Normal skin turgor, texture, and temperature. No open lesions, rashes, ulcerations, or bruising. NEUROLOGIC: Normal sensation to light touch and pressure. No paresthesias on examination. ORTHOPEDIC: Smooth pain-free range of motion of all examined joints. No ecchymosis or bruising. Pain over distal fibula, no pain over syndesmosis, deltoid, or medial  malleolus. No pain in navicular or fifth metatarsal base. No pain on palpation, slight tightness from swelling. No gross deformity.   No images are attached to the encounter.    Results RADIOLOGY Ankle X-ray: Nondisplaced distal fibular spiral fracture with adequate alignment. No increased clear space in the ankle mortise. Fibular length well preserved. (09/12/2024)   Assessment:  No diagnosis found.   Plan:  Patient was evaluated and treated and all questions answered.  Assessment and Plan Assessment & Plan Nondisplaced distal fibular fracture, left ankle Nondisplaced distal fibular spiral fracture with adequate alignment. No increased clear space in the ankle mortis and fibular length appears well preserved. Pain over the distal fibula, no pain over the syndesmosis, deltoid, medial malleolus, navicular, or fifth metatarsal base. Radiographs show no significant displacement, indicating a low likelihood of requiring surgical intervention. Conservative management is recommended with nonoperative treatment. - Continue nonoperative treatment with partial weight bearing on the left lower extremity, majority of weight on the right lower extremity. - Use cam walker boot on the left and a walker for assistive support. - Apply ice as needed for throbbing pain and elevate the ankle to reduce swelling. - Refilled hydrocodone  prescription for pain management, especially at night. - Scheduled follow-up appointment in two weeks for weight-bearing ankle radiographs to assess for any diastasis or displacement. - If radiographs show displacement or instability, will plan for operative intervention.      Return in about 16 days (around 10/02/2024) for fracture follow up (new left ankle  WB xrays).

## 2024-10-02 ENCOUNTER — Ambulatory Visit (INDEPENDENT_AMBULATORY_CARE_PROVIDER_SITE_OTHER)

## 2024-10-02 ENCOUNTER — Ambulatory Visit: Admitting: Podiatry

## 2024-10-02 VITALS — Ht 67.0 in | Wt 225.0 lb

## 2024-10-02 DIAGNOSIS — S82832A Other fracture of upper and lower end of left fibula, initial encounter for closed fracture: Secondary | ICD-10-CM | POA: Diagnosis not present

## 2024-10-06 NOTE — Progress Notes (Signed)
°  Subjective:  Patient ID: Joel George, male    DOB: 1950-12-16,  MRN: [id number]  Chief Complaint  Patient presents with   Routine Post Op    follow up fx. He states he has no pain. There is swelling and bruising    Discussed the use of AI scribe software for clinical note transcription with the patient, who gave verbal consent to proceed.  History of Present Illness Joel George is a 73 year old male who presents with an ankle injury after a fall from a ladder.  He notes great improvement so far      Objective:    Physical Exam VASCULAR: DP and PT pulse palpable. Foot is warm and well-perfused. Capillary fill time is brisk. DERMATOLOGIC: Normal skin turgor, texture, and temperature. No open lesions, rashes, ulcerations, or bruising. NEUROLOGIC: Normal sensation to light touch and pressure. No paresthesias on examination. ORTHOPEDIC: Smooth pain-free range of motion of all examined joints. No ecchymosis or bruising. Pain over distal fibula, no pain over syndesmosis, deltoid, or medial malleolus. No pain in navicular or fifth metatarsal base. No pain on palpation, slight tightness from swelling. No gross deformity.   No images are attached to the encounter.    Results RADIOLOGY Ankle X-ray: WB films taken today shows minimal lateral and posterior displacement there is no widening of mortise, shortening of fibula or increased MM clear space   Assessment:   1. Closed avulsion fracture of distal fibula, left, initial encounter      Plan:  Patient was evaluated and treated and all questions answered.  Assessment and Plan Assessment & Plan Nondisplaced distal fibular fracture, left ankle Nondisplaced distal fibular spiral fracture with adequate alignment. No increased clear space in the ankle mortis and fibular length appears well preserved.  Doing well at this time with minimal change I don't recommend we plan for any operative intervention, cont WB with walker in boot  and return in 3 weeks for new WB xrays      No follow-ups on file.

## 2024-10-23 ENCOUNTER — Ambulatory Visit (INDEPENDENT_AMBULATORY_CARE_PROVIDER_SITE_OTHER): Admitting: Podiatry

## 2024-10-23 ENCOUNTER — Ambulatory Visit (INDEPENDENT_AMBULATORY_CARE_PROVIDER_SITE_OTHER)

## 2024-10-23 VITALS — Ht 67.0 in | Wt 225.0 lb

## 2024-10-23 DIAGNOSIS — S82832A Other fracture of upper and lower end of left fibula, initial encounter for closed fracture: Secondary | ICD-10-CM

## 2024-10-23 DIAGNOSIS — S8262XD Displaced fracture of lateral malleolus of left fibula, subsequent encounter for closed fracture with routine healing: Secondary | ICD-10-CM | POA: Diagnosis not present

## 2024-10-23 DIAGNOSIS — E559 Vitamin D deficiency, unspecified: Secondary | ICD-10-CM | POA: Diagnosis not present

## 2024-10-23 NOTE — Progress Notes (Signed)
"  °  Subjective:  Patient ID: Joel George, male    DOB: 1951/08/23,  MRN: [id number]  Chief Complaint  Patient presents with   Ankle Injury    RM 2 Patient is here to f/u on fracture of the distal fibula, left. Patient states no pain or discomfort in left ankle/foot.    Discussed the use of AI scribe software for clinical note transcription with the patient, who gave verbal consent to proceed.  History of Present Illness Joel George is a 73 year old male who presents with an ankle injury after a fall from a ladder.  He notes continued improvement pain is well-controlled      Objective:    Physical Exam VASCULAR: DP and PT pulse palpable. Foot is warm and well-perfused. Capillary fill time is brisk. DERMATOLOGIC: Normal skin turgor, texture, and temperature. No open lesions, rashes, ulcerations, or bruising. NEUROLOGIC: Normal sensation to light touch and pressure. No paresthesias on examination. ORTHOPEDIC: Relatively pain-free to palpation and good range of motion of ankle joint   No images are attached to the encounter.    Results RADIOLOGY Ankle X-ray: WB films taken today shows unchanged alignment fracture site and lucency is still visible, there is some interval bridging across the distal medial portions of the fracture   Assessment:   1. Closed fracture of distal lateral malleolus of left fibula with routine healing, subsequent encounter   2. Vitamin D deficiency      Plan:  Patient was evaluated and treated and all questions answered.  Assessment and Plan Assessment & Plan Nondisplaced distal fibular fracture, left ankle So far shows some good early bridging.  There is still visible fracture site and lucency.  I recommended checking his vitamin D level.  Continue weightbearing in cam boot only.  Return in 4 weeks for new x-rays.  Okay to be fully weightbearing in boot.  I will let him know the results of his vitamin D level and supplement as  needed.      Return in about 4 weeks (around 11/20/2024) for left ankle fracture follow up (new xrays).   "

## 2024-10-26 LAB — VITAMIN D 25 HYDROXY (VIT D DEFICIENCY, FRACTURES): Vit D, 25-Hydroxy: 37.3 ng/mL (ref 30.0–100.0)

## 2024-10-28 ENCOUNTER — Ambulatory Visit: Payer: Self-pay | Admitting: Podiatry

## 2024-11-13 ENCOUNTER — Ambulatory Visit (INDEPENDENT_AMBULATORY_CARE_PROVIDER_SITE_OTHER): Admitting: Podiatry

## 2024-11-13 ENCOUNTER — Ambulatory Visit (INDEPENDENT_AMBULATORY_CARE_PROVIDER_SITE_OTHER)

## 2024-11-13 DIAGNOSIS — S8262XG Displaced fracture of lateral malleolus of left fibula, subsequent encounter for closed fracture with delayed healing: Secondary | ICD-10-CM

## 2024-11-13 DIAGNOSIS — S8262XD Displaced fracture of lateral malleolus of left fibula, subsequent encounter for closed fracture with routine healing: Secondary | ICD-10-CM | POA: Diagnosis not present

## 2024-11-14 ENCOUNTER — Encounter: Payer: Self-pay | Admitting: Podiatry

## 2024-11-14 NOTE — Progress Notes (Signed)
"  °  Subjective:  Patient ID: Joel George, male    DOB: Sep 23, 1951,  MRN: [id number]  Chief Complaint  Patient presents with   Fracture    RM 3 Patient is here for f/u on left ankle fracture. Pt states no pain, just continued swelling.    Discussed the use of AI scribe software for clinical note transcription with the patient, who gave verbal consent to proceed.  History of Present Illness Joel George is a 74 year old male who presents with an ankle injury after a fall from a ladder.  He has minimal pain      Objective:    Physical Exam VASCULAR: DP and PT pulse palpable. Foot is warm and well-perfused. Capillary fill time is brisk. DERMATOLOGIC: Normal skin turgor, texture, and temperature. No open lesions, rashes, ulcerations, or bruising. NEUROLOGIC: Normal sensation to light touch and pressure. No paresthesias on examination. ORTHOPEDIC: Relatively pain-free to palpation and good range of motion of ankle joint   No images are attached to the encounter.    Results RADIOLOGY Ankle X-ray: WB films taken today shows unchanged alignment fracture site and lucency is still visible, there is some interval bridging across the distal medial portions of the fracture, not much change since the last exam   Vitamin D  level 37.3  Assessment:   1. Closed fracture of distal lateral malleolus of left fibula with delayed healing, subsequent encounter      Plan:  Patient was evaluated and treated and all questions answered.  Assessment and Plan Assessment & Plan Nondisplaced distal fibular fracture, left ankle Bone healing and consolidation appears to be slowing down, fracture lucency is still visible now 2 months out since the injury.  I would like him to return in 6 weeks for new radiographs.  If he has not healed at that point I would recommend noninvasive bone growth stimulator to facilitate further fracture healing.  Follow-up with me at that point or sooner if he becomes more  painful or swollen.  Continue full weightbearing in cam boot.      Return in about 6 weeks (around 12/25/2024) for left ankle fracture f/u (new xrays).   "

## 2024-11-20 ENCOUNTER — Ambulatory Visit: Admitting: Podiatry

## 2024-11-28 ENCOUNTER — Telehealth: Payer: Self-pay

## 2024-11-28 NOTE — Telephone Encounter (Signed)
 Patient called and left a message - Can he start driving? His wife has been driving him everywhere and it's putting a strain on their marriage.

## 2024-12-23 ENCOUNTER — Ambulatory Visit: Admitting: Podiatry
